# Patient Record
Sex: Female | Born: 1970
Health system: Southern US, Community
[De-identification: ages and names within clinical notes are randomized; demographics above are authoritative.]

## PROBLEM LIST (undated history)

## (undated) DIAGNOSIS — S0990XA Unspecified injury of head, initial encounter: Secondary | ICD-10-CM

## (undated) DIAGNOSIS — I639 Cerebral infarction, unspecified: Secondary | ICD-10-CM

## (undated) DIAGNOSIS — R569 Unspecified convulsions: Secondary | ICD-10-CM

## (undated) HISTORY — PX: OTHER SURGICAL HISTORY: SHX169

---

## 2006-05-06 ENCOUNTER — Emergency Department: Payer: Self-pay | Admitting: Emergency Medicine

## 2006-08-03 ENCOUNTER — Emergency Department: Payer: Self-pay | Admitting: Emergency Medicine

## 2006-11-12 ENCOUNTER — Emergency Department: Payer: Self-pay | Admitting: Emergency Medicine

## 2007-09-06 ENCOUNTER — Emergency Department: Payer: Self-pay | Admitting: Internal Medicine

## 2008-02-19 ENCOUNTER — Emergency Department: Payer: Self-pay | Admitting: Emergency Medicine

## 2009-03-30 ENCOUNTER — Emergency Department: Payer: Self-pay | Admitting: Emergency Medicine

## 2009-05-12 ENCOUNTER — Emergency Department: Payer: Self-pay | Admitting: Emergency Medicine

## 2009-11-08 ENCOUNTER — Emergency Department: Payer: Self-pay | Admitting: Emergency Medicine

## 2010-05-29 ENCOUNTER — Emergency Department: Payer: Self-pay | Admitting: Emergency Medicine

## 2012-03-30 LAB — COMPREHENSIVE METABOLIC PANEL
Alkaline Phosphatase: 69 U/L (ref 50–136)
Calcium, Total: 8.3 mg/dL — ABNORMAL LOW (ref 8.5–10.1)
Co2: 27 mmol/L (ref 21–32)
Creatinine: 1.5 mg/dL — ABNORMAL HIGH (ref 0.60–1.30)
EGFR (African American): 50 — ABNORMAL LOW
EGFR (Non-African Amer.): 43 — ABNORMAL LOW
Glucose: 81 mg/dL (ref 65–99)
SGOT(AST): 14 U/L — ABNORMAL LOW (ref 15–37)
SGPT (ALT): 17 U/L (ref 12–78)
Sodium: 145 mmol/L (ref 136–145)
Total Protein: 7.2 g/dL (ref 6.4–8.2)

## 2012-03-30 LAB — CBC
HCT: 43.1 % (ref 35.0–47.0)
RDW: 14.9 % — ABNORMAL HIGH (ref 11.5–14.5)
WBC: 4.4 10*3/uL (ref 3.6–11.0)

## 2012-03-30 LAB — PROTIME-INR: INR: 0.9

## 2012-03-31 ENCOUNTER — Observation Stay: Payer: Self-pay | Admitting: Student

## 2012-03-31 DIAGNOSIS — I059 Rheumatic mitral valve disease, unspecified: Secondary | ICD-10-CM

## 2012-03-31 LAB — CK TOTAL AND CKMB (NOT AT ARMC)
CK-MB: <0.5 ng/mL — ABNORMAL LOW (ref 0.5–3.6)
CK-MB: 0.7 ng/mL (ref 0.5–3.6)

## 2012-03-31 LAB — TROPONIN I
Troponin-I: 0.02 ng/mL
Troponin-I: 0.02 ng/mL

## 2012-03-31 LAB — HEMOGLOBIN A1C: Hemoglobin A1C: 5.2 % (ref 4.2–6.3)

## 2012-04-01 LAB — LIPID PANEL
Cholesterol: 150 mg/dL (ref 0–200)
Ldl Cholesterol, Calc: 98 mg/dL (ref 0–100)
Triglycerides: 72 mg/dL (ref 0–200)

## 2012-04-01 LAB — CBC WITH DIFFERENTIAL/PLATELET
Basophil %: 1 %
Eosinophil #: 0.1 10*3/uL (ref 0.0–0.7)
Eosinophil %: 2.4 %
HCT: 38.9 % (ref 35.0–47.0)
HGB: 12.3 g/dL (ref 12.0–16.0)
MCH: 26.7 pg (ref 26.0–34.0)
MCHC: 31.8 g/dL — ABNORMAL LOW (ref 32.0–36.0)
MCV: 84 fL (ref 80–100)
Monocyte #: 0.3 x10 3/mm (ref 0.2–0.9)
Neutrophil #: 2 10*3/uL (ref 1.4–6.5)
Neutrophil %: 45.4 %
RBC: 4.62 10*6/uL (ref 3.80–5.20)

## 2012-04-01 LAB — COMPREHENSIVE METABOLIC PANEL
Albumin: 3.3 g/dL — ABNORMAL LOW (ref 3.4–5.0)
Anion Gap: 10 (ref 7–16)
Calcium, Total: 8.3 mg/dL — ABNORMAL LOW (ref 8.5–10.1)
Chloride: 109 mmol/L — ABNORMAL HIGH (ref 98–107)
Creatinine: 1.17 mg/dL (ref 0.60–1.30)
EGFR (African American): >60
Glucose: 71 mg/dL (ref 65–99)
Osmolality: 285 (ref 275–301)
Potassium: 3.8 mmol/L (ref 3.5–5.1)
Sodium: 143 mmol/L (ref 136–145)
Total Protein: 6.4 g/dL (ref 6.4–8.2)

## 2012-04-01 LAB — URINALYSIS, COMPLETE
Bacteria: NONE SEEN
Glucose,UR: NEGATIVE mg/dL (ref 0–75)
Ketone: NEGATIVE
Leukocyte Esterase: NEGATIVE
Nitrite: NEGATIVE
Specific Gravity: 1.014 (ref 1.003–1.030)
WBC UR: <1 /HPF (ref 0–5)

## 2012-04-01 LAB — PROTEIN / CREATININE RATIO, URINE
Creatinine, Urine: 134.3 mg/dL — ABNORMAL HIGH (ref 30.0–125.0)
Protein, Random Urine: 18 mg/dL — ABNORMAL HIGH (ref 0–12)
Protein/Creat. Ratio: 134 mg/gCREAT (ref 0–200)

## 2012-05-20 DIAGNOSIS — D6861 Antiphospholipid syndrome: Secondary | ICD-10-CM | POA: Diagnosis present

## 2013-12-23 ENCOUNTER — Emergency Department: Payer: Self-pay | Admitting: Emergency Medicine

## 2013-12-23 LAB — COMPREHENSIVE METABOLIC PANEL
ALBUMIN: 3.2 g/dL — AB (ref 3.4–5.0)
ALT: 10 U/L — AB (ref 12–78)
Alkaline Phosphatase: 64 U/L
Anion Gap: 9 (ref 7–16)
BUN: 18 mg/dL (ref 7–18)
Bilirubin,Total: 0.2 mg/dL (ref 0.2–1.0)
CALCIUM: 8.3 mg/dL — AB (ref 8.5–10.1)
CO2: 23 mmol/L (ref 21–32)
Chloride: 109 mmol/L — ABNORMAL HIGH (ref 98–107)
Creatinine: 1.11 mg/dL (ref 0.60–1.30)
GLUCOSE: 97 mg/dL (ref 65–99)
Osmolality: 283 (ref 275–301)
Potassium: 3.5 mmol/L (ref 3.5–5.1)
SGOT(AST): 15 U/L (ref 15–37)
SODIUM: 141 mmol/L (ref 136–145)
TOTAL PROTEIN: 6.2 g/dL — AB (ref 6.4–8.2)

## 2013-12-23 LAB — DRUG SCREEN, URINE
Amphetamines, Ur Screen: NEGATIVE (ref ?–1000)
BARBITURATES, UR SCREEN: NEGATIVE (ref ?–200)
Benzodiazepine, Ur Scrn: NEGATIVE (ref ?–200)
COCAINE METABOLITE, UR ~~LOC~~: NEGATIVE (ref ?–300)
Cannabinoid 50 Ng, Ur ~~LOC~~: POSITIVE (ref ?–50)
MDMA (Ecstasy)Ur Screen: NEGATIVE (ref ?–500)
Methadone, Ur Screen: NEGATIVE (ref ?–300)
Opiate, Ur Screen: NEGATIVE (ref ?–300)
Phencyclidine (PCP) Ur S: NEGATIVE (ref ?–25)
Tricyclic, Ur Screen: NEGATIVE (ref ?–1000)

## 2013-12-23 LAB — URINALYSIS, COMPLETE
Bilirubin,UR: NEGATIVE
Blood: NEGATIVE
Glucose,UR: NEGATIVE mg/dL (ref 0–75)
NITRITE: NEGATIVE
PH: 5 (ref 4.5–8.0)
Protein: 30
RBC,UR: 2 /HPF (ref 0–5)
Specific Gravity: 1.027 (ref 1.003–1.030)
Squamous Epithelial: 14

## 2013-12-23 LAB — CBC
HCT: 39.9 % (ref 35.0–47.0)
HGB: 13 g/dL (ref 12.0–16.0)
MCH: 27.2 pg (ref 26.0–34.0)
MCHC: 32.5 g/dL (ref 32.0–36.0)
MCV: 84 fL (ref 80–100)
Platelet: 95 10*3/uL — ABNORMAL LOW (ref 150–440)
RBC: 4.78 10*6/uL (ref 3.80–5.20)
RDW: 15 % — ABNORMAL HIGH (ref 11.5–14.5)
WBC: 7.1 10*3/uL (ref 3.6–11.0)

## 2013-12-23 LAB — ETHANOL
Ethanol %: <0.003 % (ref 0.000–0.080)
Ethanol: <3 mg/dL

## 2013-12-23 LAB — TROPONIN I: Troponin-I: <0.02 ng/mL

## 2013-12-28 LAB — CULTURE, BLOOD (SINGLE)

## 2014-02-01 ENCOUNTER — Emergency Department: Payer: Self-pay | Admitting: Emergency Medicine

## 2014-02-02 LAB — TROPONIN I: Troponin-I: <0.02 ng/mL

## 2014-02-02 LAB — COMPREHENSIVE METABOLIC PANEL
ALT: 19 U/L
Albumin: 3.5 g/dL (ref 3.4–5.0)
Alkaline Phosphatase: 62 U/L
Anion Gap: 11 (ref 7–16)
BUN: 22 mg/dL — ABNORMAL HIGH (ref 7–18)
Bilirubin,Total: 0.2 mg/dL (ref 0.2–1.0)
CO2: 21 mmol/L (ref 21–32)
Calcium, Total: 8.3 mg/dL — ABNORMAL LOW (ref 8.5–10.1)
Chloride: 109 mmol/L — ABNORMAL HIGH (ref 98–107)
Creatinine: 1.21 mg/dL (ref 0.60–1.30)
EGFR (African American): >60
EGFR (Non-African Amer.): 55 — ABNORMAL LOW
GLUCOSE: 134 mg/dL — AB (ref 65–99)
Osmolality: 287 (ref 275–301)
Potassium: 3.1 mmol/L — ABNORMAL LOW (ref 3.5–5.1)
SGOT(AST): 25 U/L (ref 15–37)
SODIUM: 141 mmol/L (ref 136–145)
TOTAL PROTEIN: 6.7 g/dL (ref 6.4–8.2)

## 2014-02-02 LAB — URINALYSIS, COMPLETE
Bacteria: NONE SEEN
Bilirubin,UR: NEGATIVE
Blood: NEGATIVE
Glucose,UR: NEGATIVE mg/dL (ref 0–75)
KETONE: NEGATIVE
Leukocyte Esterase: NEGATIVE
Nitrite: NEGATIVE
PH: 5 (ref 4.5–8.0)
Protein: NEGATIVE
RBC,UR: NONE SEEN /HPF (ref 0–5)
Specific Gravity: 1.012 (ref 1.003–1.030)
Squamous Epithelial: 1

## 2014-02-02 LAB — CBC WITH DIFFERENTIAL/PLATELET
BASOS ABS: 0 10*3/uL (ref 0.0–0.1)
BASOS PCT: 0.5 %
Eosinophil #: 0.1 10*3/uL (ref 0.0–0.7)
Eosinophil %: 2.6 %
HCT: 39.2 % (ref 35.0–47.0)
HGB: 12.6 g/dL (ref 12.0–16.0)
LYMPHS PCT: 51.7 %
Lymphocyte #: 2.8 10*3/uL (ref 1.0–3.6)
MCH: 27.2 pg (ref 26.0–34.0)
MCHC: 32.3 g/dL (ref 32.0–36.0)
MCV: 84 fL (ref 80–100)
MONOS PCT: 11.8 %
Monocyte #: 0.6 x10 3/mm (ref 0.2–0.9)
NEUTROS ABS: 1.8 10*3/uL (ref 1.4–6.5)
Neutrophil %: 33.4 %
PLATELETS: 112 10*3/uL — AB (ref 150–440)
RBC: 4.64 10*6/uL (ref 3.80–5.20)
RDW: 16.2 % — ABNORMAL HIGH (ref 11.5–14.5)
WBC: 5.4 10*3/uL (ref 3.6–11.0)

## 2014-02-02 LAB — PROTIME-INR
INR: 1
PROTHROMBIN TIME: 12.9 s (ref 11.5–14.7)

## 2014-02-02 LAB — APTT: ACTIVATED PTT: 47 s — AB (ref 23.6–35.9)

## 2014-02-02 LAB — DRUG SCREEN, URINE
AMPHETAMINES, UR SCREEN: NEGATIVE (ref ?–1000)
BARBITURATES, UR SCREEN: NEGATIVE (ref ?–200)
BENZODIAZEPINE, UR SCRN: NEGATIVE (ref ?–200)
Cannabinoid 50 Ng, Ur ~~LOC~~: POSITIVE (ref ?–50)
Cocaine Metabolite,Ur ~~LOC~~: NEGATIVE (ref ?–300)
MDMA (ECSTASY) UR SCREEN: NEGATIVE (ref ?–500)
METHADONE, UR SCREEN: NEGATIVE (ref ?–300)
OPIATE, UR SCREEN: NEGATIVE (ref ?–300)
PHENCYCLIDINE (PCP) UR S: NEGATIVE (ref ?–25)
Tricyclic, Ur Screen: NEGATIVE (ref ?–1000)

## 2014-10-19 NOTE — Discharge Summary (Signed)
PATIENT NAME:  Dawn PringleBRIGGS, Verlyn V MR#:  [id number] DATE OF BIRTH:  1970/10/04  DATE OF ADMISSION:  03/31/2012 DATE OF DISCHARGE:  04/01/2012  CONSULTANTS:  Dr. Malvin JohnsPotter from neurology  [facility].   CHIEF COMPLAINT: Slurred speech, left-sided weakness and numbness.   DISCHARGE DIAGNOSES:  1. Left-sided weakness and numbness, possibly from seizure, also possible as a transient ischemic attack.  2. Acute renal failure.  3. Seizure disorder/epilepsy.  4. Noncompliant with aspirin.  5. Mitral regurgitation from rheumatoid valvular heart disease.  6. History of multiple transient ischemic attacks in the past.   7. Morbid obesity.  8. Tobacco abuse.   DISCHARGE MEDICATIONS:  1. Simvastatin 40 mg daily.  2. Aspirin 325 mg daily.  3. Buspirone 7.5 mg at bedtime and 15 mg in the morning.  4. Sertraline 100 mg at bedtime.  5. Keppra 1500 mg every 12 hours.   DIET: Low sodium.   ACTIVITY: As tolerated.   DISCHARGE FOLLOWUP:  1. Please follow up with Dr. Malvin JohnsPotter from neurology within 1 to 2 days.  2. Please follow up with [facility] care physician [facility] and scheduling for a transesophageal echocardiogram within 1 to 2 days for evaluation of mitral valve disease.   DISPOSITION: Home.   CODE STATUS: FULL CODE.   HISTORY OF PRESENT ILLNESS: For full details of the history and physical, please see the History and Physical by Dr. Cherylann RatelLateef on 03/31/2012. Briefly, this is a 44 year old female with history of hyperlipidemia, seizure disorder, multiple transient ischemic attacks in the past who came in with acute onset of left-sided numbness and weakness with some slurred speech that lasted about 30 minutes and improved by the time of admission. She [facility] Of note, she had not taken her aspirin, said she has been taking ibuprofen by mistake for about a month or so. She had a CT of the brain which did  not show any acute intracranial process and was admitted to the hospitalist service. She was also noted to have acute renal failure with creatinine of about 1.5 on arrival and chronic thrombocytopenia.   SIGNIFICANT LABS AND IMAGING: Initial creatinine 1.5, by discharge 1.17. Initial chloride 113, LDL 98, HDL 38, hemoglobin A1c 5.2. LFTs: AST 14 and otherwise within normal limits. On arrival troponins were negative times three. Platelets were 78 initially and 81 on 10/01. WBC  4.4, hemoglobin 13.9. Initial INR 0.9. Urine creatinine was elevated at 134, creatinine to protein ratio was 134, random urine protein was 18. Urinalysis not suggestive of infection. CT of the head showing no evidence of mass effect, midline shift, or extra-axial fluid collection. There is an old left occipital lobe and right frontal lobe infarct with encephalomalacia. No acute intracranial process. MRI of the brain without contrast showing chronic ischemic changes. No acute abnormalities. There is encephalomalacia in the frontal parietal region consistent with old infarct. Similar findings noted in the left cerebellar hemisphere. Ultrasound of the kidneys bilaterally showing normal renal ultrasound and ultrasound of the carotids bilaterally showing normal carotid Doppler ultrasound. Echocardiogram done on 09/30 showing ejection fraction of 50 to 55%, borderline concentric left ventricular hypertrophy, elevated left atrial pressures, mitral valve leaflets appeared thickened but opened well, calcified mitral apparatus? with mild rheumatic deformity. Posterior leaflet has limited mobility, mild mitral stenosis. There is moderate to severe mitral regurgitation. EEG done on 10/01 showing epileptiform activity in the right frontotemporal head region and  occasional generalized spike wave complexes. Epileptiform activity in the frontotemporal head region indicates cortical irritability with increased predisposition to focal onset epilepsy from this  location. Occasional generalized spike and wave complexes raise the possibility of  primary generalized epilepsy syndrome.   HOSPITAL COURSE: The patient was admitted to the hospitalist service and underwent extensive work-up. The patient had no further neurological dysfunction. Neurology was consulted. The thought was initially that she had a transient ischemic attack. She has had multiple transient ischemic attacks in the past and the CT scan shows encephalomalacia in several regions. MRI of the head was negative for acute stroke and carotid ultrasound did not show any significant stenosis. She had not taken her aspirin and compliance was thoroughly encouraged. Per neurology it is also possible she has been having seizures and therefore an EEG was ordered, the result of which is dictated above. It is possible she has been having complex partial seizures. The recommendation was to increase Keppra to 1500 mg q.12 hours and follow up with neurology in the near future. Furthermore, an echocardiogram was obtained showing rheumatoid mitral valve disease and a TEE was recommended. This was discussed with the patient and she states she can follow up with [facility] for that. She had no further seizure activity while in the hospital. She does have chronic thrombocytopenia, which is chronic and stable. At this point, she will be discharged with outpatient followup. She did have mild renal failure on arrival with creatinine of 1.5, which did improve by discharge. She had no significant hydronephrosis but does have elevated protein in the urine. It is possible it is from NSAIDs. She was discouraged from taking further ibuprofen. She should follow up and obtain a basic metabolic panel upon discharge with her primary care physician.   CODE STATUS: The patient is a FULL CODE.   TOTAL TIME SPENT: 35 minutes.   ____________________________ Krystal Eaton, MD sa:bjt D: 04/02/2012 14:02:35 ET T: 04/02/2012 16:54:04  ET JOB#: [id number]  cc: Krystal Eaton, MD, <Dictator> Krystal Eaton MD ELECTRONICALLY SIGNED 04/04/2012 15:11

## 2014-10-19 NOTE — H&P (Signed)
PATIENT NAME:  Dawn Stein, FELD MR#:  [id number] DATE OF BIRTH:  28-Mar-1971  DATE OF ADMISSION:  03/31/2012  REFERRING PHYSICIAN: Dr. Dahlia Client    CHIEF COMPLAINT: Slurred speech, left-sided weakness and numbness.   HISTORY OF PRESENT ILLNESS: The patient is a 44 year old African American female with past medical history of seizure disorder, hyperlipidemia, multiple TIAs, approximately 8 per the patient's report, who presented to [facility] with acute onset of left-sided numbness and weakness along with slurring of speech. The patient reports that these symptoms occurred at approximately 4 p.m. The patient was at church earlier in the day and then went home. She was at home with her family at which point in time they had a large meal. The patient reports that she began to wash the dishes and developed left-sided weakness and numbness. She reports that she had dropped a dish. The family members also noted some slurring of speech. These symptoms apparently lasted 30 minutes and significantly improved. The patient reports to me that she's had a number of TIAs numbering approximately 8 per her report. She is followed by a neurologist at [facility] named Dr. Purvis Kilts. The patient was supposed to have been taking aspirin 325 mg p.o. daily, however, the patient reports that she has not been taking this for approximately one month. Instead, she was taking ibuprofen. She is not on any other antiplatelet therapy at this time. She had a CT scan of the brain which did not demonstrate any acute intracranial process. The patient did have some lab abnormalities, however. The patient's creatinine is elevated at 1.5. Her prior baseline was 1.19 on 11/08/2009. She also appears to have thrombocytopenia of undetermined etiology. Platelet count is currently 78,000.   PAST MEDICAL HISTORY:  1. Seizure disorder.  2. Hyperlipidemia.  3. History of recurrent TIAs, approximately 8 total according to  patient report.  4. Morbid obesity, BMI 40.3.  5. Tobacco abuse.   PAST SURGICAL HISTORY: None.   ALLERGIES: Tetracycline.   HOME MEDICATIONS:  1. Simvastatin 40 mg p.o. daily.  2. Aspirin 325 mg daily.  3. Keppra 500 mg in the a.m. and 1000 mg in the p.m.  4. Buspirone 15 mg p.o. t.i.d.   SOCIAL HISTORY: The patient lives in [place]. She is separated. She has one son. She is currently on disability. She smokes tobacco approximately 1 pack per day. She drinks alcohol occasionally. She also uses marijuana occasionally.   FAMILY HISTORY: Significant for hypertension and diabetes mellitus.   REVIEW OF SYSTEMS: CONSTITUTIONAL: Currently denies fevers, chills, or weight loss. EYES: Denies double vision or blurry vision. HEENT: Denies headaches, hearing loss. Denies epistaxis or sore throat. CARDIOVASCULAR: Denies chest pain, palpitations, PND, orthopnea. RESPIRATORY: Denies cough, shortness of breath, or hemoptysis. GI: Denies nausea, vomiting, diarrhea, or bloody stools. GU: Denies frequency, urgency, or dysuria. MUSCULOSKELETAL: Denies joint pain, swelling, or redness. INTEGUMENTARY: Denies skin rashes or lesions. NEUROLOGIC: Previously had slurred speech as well as left-sided weakness and numbness but these symptoms have now resolved. PSYCHIATRIC: No reported depression. ENDOCRINE: Denies polyuria, polydipsia, or polyphagia. HEMATOLOGIC/LYMPHATIC: Denies easy bruisability, bleeding, or swollen lymph nodes. ALLERGY/IMMUNOLOGIC: Denies seasonal allergies or history of immunodeficiency.   PHYSICAL EXAMINATION:   VITAL SIGNS: Temperature 98.5, pulse 68, respirations 22, blood pressure 143/77.   GENERAL: Well developed, well nourished, obese African American female currently in no acute distress.   HEENT: Normocephalic, atraumatic. Extraocular movements are intact. Pupils equal, round, and reactive to light. No scleral icterus. Conjunctivae are pink.  No epistaxis noted. Gross hearing intact. Oral  mucosa moist.   NECK: Supple without JVD or lymphadenopathy. No carotid bruits auscultated.   LUNGS: Clear to auscultation bilaterally with normal respiratory effort.   CARDIOVASCULAR: S1, S2 regular rate and rhythm. No obvious murmurs or rubs appreciated.   ABDOMEN: Obese, soft, nontender, nondistended. Bowel sounds positive. No rebound or guarding. No gross organomegaly appreciated.   EXTREMITIES: No clubbing, cyanosis, or edema.   NEUROLOGIC: The patient is alert and oriented to time, person, and place. Strength is 5 out of 5 in both upper and lower extremities. Speech is now normal. Sensation is intact grossly in the upper and lower extremities. Cranial nerves II through XII appear to be within normal limits and without gross deficit at this time.   MUSCULOSKELETAL: No joint redness, swelling, or tenderness appreciated.   SKIN: Warm and dry. No rashes noted.   PSYCHIATRIC: The patient is with appropriate affect, appears to have good insight into her current illness.   LABORATORY DATA: CBC shows WBC 4.4, hemoglobin 13.9, hematocrit 43.1, platelets low at 78. CMP shows sodium 145, potassium 4, chloride 113, CO2 27, BUN 18, creatinine 1.5, glucose 81, alkaline phosphatase 89, ALT 17, AST 14, total protein 7.2, eGFR 50. INR 0.9.  CT head without contrast was negative for acute intracranial process.  EKG shows normal sinus rhythm.   IMPRESSION: This is a 44 year old African American female with past medical history of seizure disorder, hyperlipidemia, reported TIAs x8 in the past, morbid obesity, tobacco abuse, and history of THC use who presented to [facility] with acute onset of left-sided weakness, numbness, and slurred speech which has now resolved.  1. Transient ischemic attack. The patient's symptoms appear to be compatible with another TIA. She has had recurrent TIAs in the past numbering 8 per her report. Her symptoms now appear to have resolved. The patient  was to be taking aspirin 325 mg daily at home, however, over the past month has been taking ibuprofen instead of aspirin. The patient is unclear as to why she made this error. We will resume the patient on aspirin 325 mg daily for now. Will also obtain MRI of the brain to exclude an acute stroke. Will also obtain 2-D echocardiogram and carotid duplex as the patient reports that she has not had any of these studies recently. She is followed by Dr. Purvis Kilts of Neurology at [facility]. Given the recurrent nature of these episodes, we will obtain Neurology consultation with Dr. Manuella Ghazi as well.  2. Acute renal failure. The patient's creatinine is elevated at 1.5. On 11/08/2009 creatinine was 1.19. However, it is possible that she may have underlying chronic kidney disease. Will obtain urinalysis as well as urine protein to creatinine ratio. We will start the patient on IV fluids with half normal saline to see if we can treat any acute component of her renal failure now.  3. Seizure disorder. We will resume Keppra at home doses of 500 mg in the a.m. and 1000 mg in the p.m.  4. Thrombocytopenia. This also appears to be chronic in nature. The patient had low platelets on 11/08/2009 at 110,000. Platelets are lower now at 78,000. The patient may end up requiring outpatient Hematology evaluation for this. She is followed at [facility] for primary care. They may have performed this work-up while the patient was there previously.  5. DVT prophylaxis. Will start the patient on heparin 5000 units sub-Q q.12 hours.  6. Hyperlipidemia. We will  resume the patient on simvastatin 40 mg p.o. daily and check fasting lipid profile.   CODE STATUS: FULL CODE.   TIME SPENT: One hour.   ____________________________ Tama High, MD mnl:drc D: 03/31/2012 04:45:25 ET T: 03/31/2012 06:49:46 ET JOB#: [id number]  cc: Tama High, MD, <Dictator> Mariah Milling Merelyn Klump MD ELECTRONICALLY SIGNED 04/08/2012 23:30

## 2014-10-19 NOTE — Consult Note (Signed)
Referring Physician:  Tama High   Primary Care Physician:  Sheldon Silvan, [address], [place], [place], [place] [address], [phone]  Reason for Consult:  Admit Date: 31-Mar-2012   Reason for Consult: CVA   History of Present Illness:  History of Present Illness:   PATIENT NAME:  Dawn Stein, Dawn Stein [id number] OF BIRTH:  09-08-1970 OF ADMISSION:  03/31/2012 COMPLAINT: Slurred speech, left-sided weakness and numbness.  OF PRESENT ILLNESS:  African American woman with a history of hyperlipidemia, multiple TIAs, and seizure disorder who presented to [facility] with acute onset of left-sided numbness and weakness and slurred speech. She reports that these symptoms occurred at approximately 4 p.m yesterday.  She states that after eating a large meal she was washing the dishes when she noticed that she was beginning to have left-sided weakness and numbness.  This resulted in her dropping a dish that she was washing.  She also noticed mildly slurred speech and says that it "felt like my tongue was too big".  Her symptoms lasted less than an hour and then improved.  She is back to normal now she says.  She had a head CT performed that do not demonstrate any acute abnormalities.  Her creatinine was seen to be elevated at 1.5.  She reportedly has had a number of TIAs in the past with no residual deficits.  She has not been taking aspirin for stroke prevention.  She says that her current symptoms not resemble her normal seizures which per her have only occurred at night in the past.   MEDICAL HISTORY:  1. Seizure disorder.  2. Hyperlipidemia.  History of recurrent TIAs, approximately 8 total according to patient report.  Morbid obesity, BMI 40.3.  Tobacco abuse.  SURGICAL HISTORY: None.   ALLERGIES: Tetracycline.  MEDICATIONS:   1. Simvastatin 40 mg p.o. daily.  2. Aspirin 325 mg daily.  Keppra 500 mg in the a.m. and 1000 mg in the p.m.  Buspirone 15  mg p.o. t.i.d.  HISTORY: The patient lives in [place]. She is separated. She has one son. She is currently on disability. She smokes tobacco approximately 1 pack per day. She drinks alcohol occasionally. She also uses marijuana occasionally.  HISTORY: Significant for hypertension and diabetes mellitus.  well developed woman.  Normocephalic and atraumatic. exam without pharmacologic dilation shows normal disc size, appearance and C/D ratio.  There is no papilledema. and S2 sounds are within normal limits, without murmurs, gallops, or rubs. - Obese- NormalDrift - Absent bilaterally.- Patient declined because she is eating dinner.   Shoulder abduction (deltoid/supraspinatus, axillary/suprascapular n, C5)   Elbow flexion (biceps brachii, musculoskeletal n, C5-6)   Elbow extension (triceps, radial n, C7)   Finger adduction (interossei, ulnar n, T1)    Hip flexion (iliopsoas, L1/L2)   Knee flexion (hamstrings, sciatic n, L5/S1)    Knee extension (quadriceps, femoral n, L3/4)   Ankle dorsiflexion (tibialis anterior, deep fibular n, L4/5)   Ankle plantarflexion (gastroc, tibial n, S1)  STATUS.is oriented to person, place and time.  Recent and remote memory are intact.  Attention span and concentration are intact.  Naming, repetition, comprehension and expressive speech are within normal limits.  Patient's fund of knowledge is within normal limits for educational level. NERVES.   CN II (normal visual acuity and visual fields)   CN III, IV, VI (extraocular muscles are intact)   CN V (facial sensation is intact bilaterally)   CN VII (facial strength is  intact bilaterally)   CN VIII (hearing is intact bilaterally)   CN IX/X (palate elevates midline, normal phonation)   CN XI (shoulder shrug strength is normal and symmetric)   CN XII (tongue protrudes midline) to pain and temp bilaterally (spinothalamic tracts)to position and vibration bilaterally (dorsal columns)    Biceps   Brachioradialis    Patellar   Achillessign  is absent bilaterally. to nose testing is within normal limits bilaterally. head without contrast was negative for acute intracranial process. shows normal sinus rhythm.   African American woman with a history of hyperlipidemia, multiple TIAs, and seizure disorder who presented to [facility] with acute onset of left-sided numbness and weakness and slurred speech.   POSSIBLE RIGHT MCA DISTRIBUTION TIAContinue frequent neuro checks (Q4h) for first 24 hours and reassess frequency after that time.Please call neuro and neurosurgery for any changes in neuro exam from above exam with particular attention to pupillary anisocoria.Brain MRI/A to evaluate for other structural lesions or dissections.Carotid doppler to rule out carotid stenosisTTE with bubble study to evaluate for intracardiac shunts or cardiac thrombiMonitor on telemetry and repeat EKG to r/o afibCheck fasting lipid panel, TSH and HbA1cCycle cardiac enzymesCONCERNGiven that she has had 8-9 events like this lifetime, I would recommend a routine EEG to see if she may be suffering from an occasional simple or complex partial seizure that mimics a stroke.  It is very unusual to continue to have frequent TIAs without a persistent neuro deficit or finding on neuroimaging. Cardiac monitoring as described above.Antiplatelet therapy: aspirin 325 mg PO dailyTREATMENT:Per ATP III guidlines, given that patient has other stroke risk factors such as diabetes, elevated CRP, carotid disease, treat if LDL > 70PRESSURE CONTROL:During the first several hours after stroke, control for factors such as anxiety/stress/pain. When these factors are controlled, if blood pressure remains higher than 220/110, treat with a short acting agent (IV labetalol).Allow permissive HTN for BP less than 220/110The 2 trials of antihypertensive therapy to prevent recurrent stroke that have shown benefit started medication >2 weeks and >4 weeks after the acute stroke.PT, OT, Speech  evaluationsConsult speech therapy for swallowing assessment and stroke education.Strict NPO until cleared by speech therapy.Fall PrecautionsCounseling for smoke cessation as needed.DVT prophylaxis with [place] lovenox. Melrose Nakayama, MD   ROS:   General denies complaints    HEENT no complaints    Lungs no complaints    Cardiac no complaints    GI no complaints    GU no complaints    Musculoskeletal no complaints    Extremities no complaints    Skin no complaints    Neuro no complaints    Endocrine no complaints   Past Medical/Surgical Hx:  CVA/Stroke:   Depression:   Epilepsy:   Denies:   Home Medications: Medication Instructions Last Modified Date/Time  aspirin 325 mg oral enteric coated tablet   once a day  30-Sep-13 06:38  simvastatin 40 mg oral tablet   once a day (at bedtime)  30-Sep-13 06:38  busPIRone 7.5 mg oral tablet tab(s) orally once a day (at bedtime) 30-Sep-13 06:38  busPIRone 15 mg tablet 1 tab(s) orally once a day 30-Sep-13 06:38  levetiracetam 500 mg oral tablet 2.5 tab(s) orally 2 times a day 30-Sep-13 06:38  sertraline 100 mg oral tablet 1 tab(s) orally once a day (at bedtime) 30-Sep-13 06:38   [facility] Current Meds:  Aspirin Enteric Coated tablet, ( Ecotrin)  325 mg Oral daily  - Indication: Pain/Fever/Thromboembolic Disorders/Post MI/Prophylaxis MI  Instructions:  Initiate Bleeding Precautions Protocol--DO NOT  CRUSH  Simvastatin tablet, ( Zocor)  40 mg Oral at bedtime  - Indication: Hypercholesterolemia  levETIRAcetam  tablet,  ( Keppra)  500 mg Oral <User Schedule> ( every 1 day: 10:00 )  - Indication: Seizures, bipolar disorder  levETIRAcetam  tablet,  ( Keppra)  1000 mg Oral at bedtime  - Indication: Seizures, bipolar disorder  HePARin injection, 5000 unit(s), Subcutaneous, q12h  Indication: Anticoagulant, Monitor Anticoags per hospital protocol  Allergies:  Tetracycline: Swelling  Vital Signs: **Vital Signs.:   30-Sep-13 11:45   Vital  Signs Type Routine   Temperature Temperature (F) 97.5   Celsius 36.3   Pulse Pulse 59   Respirations Respirations 16   Systolic BP Systolic BP 85   Diastolic BP (mmHg) Diastolic BP (mmHg) 58   Mean BP 67   Pulse Ox % Pulse Ox % 100   Pulse Ox Activity Level  At rest   Oxygen Delivery Room Air/ 21 %   Lab Results:  Hepatic:  29-Sep-13 20:40    Bilirubin, Total 0.2   Alkaline Phosphatase 69   SGPT (ALT) 17   SGOT (AST)  14   Total Protein, Serum 7.2   Albumin, Serum 3.6  Routine Chem:  29-Sep-13 20:40    Glucose, Serum 81   BUN 18   Creatinine (comp)  1.50   Sodium, Serum 145   Potassium, Serum 4.0   Chloride, Serum  113   CO2, Serum 27   Calcium (Total), Serum  8.3   Osmolality (calc) 290   eGFR (African American)  50   eGFR (Non-African American)  43 (eGFR values <48m/min/1.73 m2 may be an indication of chronic kidney disease (CKD). Calculated eGFR is useful in patients with stable renal function. The eGFR calculation will not be reliable in acutely ill patients when serum creatinine is changing rapidly. It is not useful in  patients on dialysis. The eGFR calculation may not be applicable to patients at the low and high extremes of body sizes, pregnant women, and vegetarians.)   Anion Gap  5  Cardiac:  30-Sep-13 05:36    Troponin I 0.03 (0.00-0.05 0.05 ng/mL or less: NEGATIVE  Repeat testing in 3-6 hrs  if clinically indicated. >0.05 ng/mL: POTENTIAL  MYOCARDIAL INJURY. Repeat  testing in 3-6 hrs if  clinically indicated. NOTE: An increase or decrease  of 30% or more on serial  testing suggests a  clinically important change)   CK, Total 113   CPK-MB, Serum  < 0.5 (Result(s) reported on 31 Mar 2012 at 06:15AM.)  Routine Coag:  29-Sep-13 20:40    Prothrombin 12.2   INR 0.9 (INR reference interval applies to patients on anticoagulant therapy. A single INR therapeutic range for coumarins is not optimal for all indications; however, the suggested range for  most indications is 2.0 - 3.0. Exceptions to the INR Reference Range may include: Prosthetic heart valves, acute myocardial infarction, prevention of myocardial infarction, and combinations of aspirin and anticoagulant. The need for a higher or lower target INR must be assessed individually. Reference: The Pharmacology and Management of the Vitamin K  antagonists: the seventh ACCP Conference on Antithrombotic and Thrombolytic Therapy. CDIYME.1583Sept:126 (3suppl): [id number] A HCT value >55% may artifactually increase the PT.  In one study,  the increase was an average of 25%. Reference:  "Effect on Routine and Special Coagulation Testing Values of Citrate Anticoagulant Adjustment in Patients with High HCT Values." American Journal of Clinical Pathology 2006;126:400-405.)  Routine Hem:  29-Sep-13 20:40  WBC (CBC) 4.4   RBC (CBC) 5.09   Hemoglobin (CBC) 13.9   Hematocrit (CBC) 43.1   Platelet Count (CBC)  78 (Result(s) reported on 30 Mar 2012 at 08:08PM.)   MCV 85   MCH 27.4   MCHC 32.4   RDW  14.9   Radiology Results: [place]:    30-Sep-13 10:25, US Carotid Doppler Bilateral   US Carotid Doppler Bilateral    REASON FOR EXAM:    TIA  COMMENTS:       PROCEDURE: [place]  - US CAROTID DOPPLER BILATERAL  - Mar 31 2012 10:25AM     RESULT: Duplex carotid sonography was performed with spectral analysis.    Neither the right nor left carotid systems demonstrate evidence of   calcified or soft plaque. The waveform patterns and the color flow images   are normal. On the right the peak internal carotid systolic velocity   measured 89 cm per second and the peak common carotid velocity measured   78 cm per second corresponding to normal ratio of 1.1. On the left the   peak internal carotid systolic velocity measured 71 cm per second and the   peak common carotid velocity measured 64 cm per second corresponding to a   normal ratio of 1.1. The vertebral arteries are normal in flow direction      bilaterally.    IMPRESSION:  Normal carotid Doppler ultrasound examination.     Dictation Site: 2          Verified By: DAVID A. Martinique, M.D., MD  MRI:    30-Sep-13 10:58, MRI Brain Without Contrast   MRI Brain Without Contrast    REASON FOR EXAM:    TIA  COMMENTS:       PROCEDURE: MR  - MR BRAIN WO CONTRAST  - Mar 31 2012 10:58AM     RESULT: History: TIA.    Comparison Study: Prior head CT of 03/30/2012.    Findings: Multiplanar, multisequence images brain is obtained.   Diffusion-weighted images are normal. Deep white matter and subcortical   white matter changes noted consistent with chronic ischemia.   Encephalomalacia noted in the right frontoparietal region consistent with   old infarct. Similar finding noted in the left cerebellar hemisphere.   Orbits and paranasal sinuses are normal. Posterior fossa is otherwise     normal. Vascular flow voids are normal. There are no mass lesions. No   hydrocephalus.    IMPRESSION:  Chronic ischemic change. No acute abnormality.          Verified By: Osa Craver, M.D., MD  CT:    29-Sep-13 19:53, CT Head Without Contrast   CT Head Without Contrast    REASON FOR EXAM:    slurred speech, unilateral numbness, h/o cva  COMMENTS:       PROCEDURE: CT  - CT HEAD WITHOUT CONTRAST  - Mar 30 2012  7:53PM     RESULT: Comparison:  None    Technique: Multiple axial images from the foramen magnum to the vertex  were obtained without IV contrast.    Findings:      There is no evidence of mass effect, midline shift, or extra-axial fluid   collections.  There is no evidence of a space-occupying lesion or   intracranial hemorrhage. There is no evidence of a cortical-based area of     acute infarction. There is an old left occipital lobe and right frontal   lobe infarct with encephalomalacia.  The ventricles and sulci are appropriate for the patient's age. The basal   cisterns are patent.    Visualized portions of  the orbits are unremarkable. The visualized   portions of the paranasal sinuses and mastoid air cells are unremarkable.     The osseous structures are unremarkable.    IMPRESSION:      No acute intracranial process.    Dictation Site: 1          Verified By: Jennette Banker, M.D., MD   Electronic Signatures: Anabel Bene (MD)  (Signed 30-Sep-13 18:24)  Authored: REFERRING PHYSICIAN, Primary Care Physician, Consult, History of Present Illness, Review of Systems, PAST MEDICAL/SURGICAL HISTORY, HOME MEDICATIONS, Current Medications, ALLERGIES, NURSING VITAL SIGNS, LAB RESULTS, RADIOLOGY RESULTS   Last Updated: 30-Sep-13 18:24 by Anabel Bene (MD)

## 2015-01-21 ENCOUNTER — Other Ambulatory Visit: Payer: Self-pay | Admitting: Family Medicine

## 2015-01-21 DIAGNOSIS — Z1231 Encounter for screening mammogram for malignant neoplasm of breast: Secondary | ICD-10-CM

## 2015-01-27 ENCOUNTER — Ambulatory Visit
Admission: RE | Admit: 2015-01-27 | Discharge: 2015-01-27 | Disposition: A | Discharge status: Home / Self Care | Payer: Medicare Other | Source: Ambulatory Visit | Attending: Family Medicine | Admitting: Family Medicine

## 2015-01-27 ENCOUNTER — Ambulatory Visit: Payer: Medicare Other

## 2015-01-27 DIAGNOSIS — Z1231 Encounter for screening mammogram for malignant neoplasm of breast: Secondary | ICD-10-CM | POA: Insufficient documentation

## 2015-01-27 HISTORY — DX: Unspecified convulsions: R56.9

## 2015-04-08 ENCOUNTER — Emergency Department: Payer: Medicare Other

## 2015-04-08 ENCOUNTER — Encounter: Payer: Self-pay | Admitting: Emergency Medicine

## 2015-04-08 ENCOUNTER — Observation Stay
Admission: EM | Admit: 2015-04-08 | Discharge: 2015-04-09 | Disposition: A | Discharge status: Home / Self Care | Payer: Medicare Other | Attending: Specialist | Admitting: Specialist

## 2015-04-08 ENCOUNTER — Observation Stay: Payer: Medicare Other

## 2015-04-08 DIAGNOSIS — Z801 Family history of malignant neoplasm of trachea, bronchus and lung: Secondary | ICD-10-CM | POA: Diagnosis not present

## 2015-04-08 DIAGNOSIS — Z79899 Other long term (current) drug therapy: Secondary | ICD-10-CM | POA: Diagnosis not present

## 2015-04-08 DIAGNOSIS — R42 Dizziness and giddiness: Secondary | ICD-10-CM | POA: Diagnosis not present

## 2015-04-08 DIAGNOSIS — Z881 Allergy status to other antibiotic agents status: Secondary | ICD-10-CM | POA: Insufficient documentation

## 2015-04-08 DIAGNOSIS — Z87892 Personal history of anaphylaxis: Secondary | ICD-10-CM | POA: Insufficient documentation

## 2015-04-08 DIAGNOSIS — F329 Major depressive disorder, single episode, unspecified: Secondary | ICD-10-CM | POA: Insufficient documentation

## 2015-04-08 DIAGNOSIS — F1210 Cannabis abuse, uncomplicated: Secondary | ICD-10-CM | POA: Insufficient documentation

## 2015-04-08 DIAGNOSIS — R531 Weakness: Secondary | ICD-10-CM | POA: Insufficient documentation

## 2015-04-08 DIAGNOSIS — F17210 Nicotine dependence, cigarettes, uncomplicated: Secondary | ICD-10-CM | POA: Diagnosis not present

## 2015-04-08 DIAGNOSIS — R471 Dysarthria and anarthria: Secondary | ICD-10-CM

## 2015-04-08 DIAGNOSIS — Z7982 Long term (current) use of aspirin: Secondary | ICD-10-CM | POA: Insufficient documentation

## 2015-04-08 DIAGNOSIS — R4781 Slurred speech: Principal | ICD-10-CM | POA: Insufficient documentation

## 2015-04-08 DIAGNOSIS — Z8673 Personal history of transient ischemic attack (TIA), and cerebral infarction without residual deficits: Secondary | ICD-10-CM | POA: Diagnosis not present

## 2015-04-08 DIAGNOSIS — G40909 Epilepsy, unspecified, not intractable, without status epilepticus: Secondary | ICD-10-CM | POA: Insufficient documentation

## 2015-04-08 DIAGNOSIS — I639 Cerebral infarction, unspecified: Secondary | ICD-10-CM | POA: Diagnosis present

## 2015-04-08 DIAGNOSIS — Z23 Encounter for immunization: Secondary | ICD-10-CM | POA: Diagnosis not present

## 2015-04-08 DIAGNOSIS — R4701 Aphasia: Secondary | ICD-10-CM | POA: Diagnosis not present

## 2015-04-08 HISTORY — DX: Cerebral infarction, unspecified: I63.9

## 2015-04-08 LAB — COMPREHENSIVE METABOLIC PANEL
ALK PHOS: 63 U/L (ref 38–126)
ALT: 9 U/L — ABNORMAL LOW (ref 14–54)
ANION GAP: 6 (ref 5–15)
AST: 14 U/L — ABNORMAL LOW (ref 15–41)
Albumin: 3.6 g/dL (ref 3.5–5.0)
BILIRUBIN TOTAL: 0.4 mg/dL (ref 0.3–1.2)
BUN: 15 mg/dL (ref 6–20)
CALCIUM: 8.1 mg/dL — AB (ref 8.9–10.3)
CO2: 23 mmol/L (ref 22–32)
Chloride: 112 mmol/L — ABNORMAL HIGH (ref 101–111)
Creatinine, Ser: 1.36 mg/dL — ABNORMAL HIGH (ref 0.44–1.00)
GFR calc non Af Amer: 47 mL/min — ABNORMAL LOW (ref >60)
GFR, EST AFRICAN AMERICAN: 54 mL/min — AB (ref >60)
Glucose, Bld: 70 mg/dL (ref 65–99)
Potassium: 4.1 mmol/L (ref 3.5–5.1)
Sodium: 141 mmol/L (ref 135–145)
TOTAL PROTEIN: 6.6 g/dL (ref 6.5–8.1)

## 2015-04-08 LAB — DIFFERENTIAL
Basophils Absolute: 0 10*3/uL (ref 0–0.1)
Basophils Relative: 1 %
Eosinophils Absolute: 0.1 10*3/uL (ref 0–0.7)
LYMPHS ABS: 1.3 10*3/uL (ref 1.0–3.6)
MONO ABS: 0.3 10*3/uL (ref 0.2–0.9)
Neutro Abs: 2 10*3/uL (ref 1.4–6.5)
Neutrophils Relative %: 54 %

## 2015-04-08 LAB — APTT: aPTT: 47 seconds — ABNORMAL HIGH (ref 24–36)

## 2015-04-08 LAB — CBC
HEMATOCRIT: 41.2 % (ref 35.0–47.0)
HEMOGLOBIN: 13.3 g/dL (ref 12.0–16.0)
MCH: 27.2 pg (ref 26.0–34.0)
MCHC: 32.4 g/dL (ref 32.0–36.0)
MCV: 83.9 fL (ref 80.0–100.0)
Platelets: 79 10*3/uL — ABNORMAL LOW (ref 150–440)
RBC: 4.91 MIL/uL (ref 3.80–5.20)
RDW: 14.9 % — ABNORMAL HIGH (ref 11.5–14.5)
WBC: 3.7 10*3/uL (ref 3.6–11.0)

## 2015-04-08 LAB — TROPONIN I: Troponin I: <0.03 ng/mL (ref <0.031)

## 2015-04-08 LAB — PROTIME-INR
INR: 0.95
PROTHROMBIN TIME: 12.9 s (ref 11.4–15.0)

## 2015-04-08 LAB — GLUCOSE, CAPILLARY: GLUCOSE-CAPILLARY: 72 mg/dL (ref 65–99)

## 2015-04-08 MED ORDER — ENOXAPARIN SODIUM 40 MG/0.4ML ~~LOC~~ SOLN
40.0000 mg | SUBCUTANEOUS | Status: DC
  Administered 2015-04-08: 17:00:00 40 mg via SUBCUTANEOUS
  Filled 2015-04-08 (×2): qty 0.4

## 2015-04-08 MED ORDER — SIMVASTATIN 40 MG PO TABS
40.0000 mg | ORAL_TABLET | Freq: Every day | ORAL | Status: DC
  Administered 2015-04-08: 21:00:00 40 mg via ORAL
  Filled 2015-04-08: qty 1

## 2015-04-08 MED ORDER — LEVETIRACETAM 500 MG PO TABS: 1250.0000 mg | ORAL_TABLET | Freq: Two times a day (BID) | ORAL | Status: DC

## 2015-04-08 MED ORDER — SERTRALINE HCL 50 MG PO TABS
100.0000 mg | ORAL_TABLET | Freq: Every day | ORAL | Status: DC
  Administered 2015-04-09: 09:00:00 100 mg via ORAL
  Filled 2015-04-08: qty 2

## 2015-04-08 MED ORDER — NICOTINE 21 MG/24HR TD PT24
21.0000 mg | MEDICATED_PATCH | Freq: Every day | TRANSDERMAL | Status: DC
  Administered 2015-04-08 – 2015-04-09 (×2): 21 mg via TRANSDERMAL
  Filled 2015-04-08 (×2): qty 1

## 2015-04-08 MED ORDER — ONDANSETRON HCL 4 MG/2ML IJ SOLN: 4.0000 mg | Freq: Four times a day (QID) | INTRAMUSCULAR | Status: DC | PRN

## 2015-04-08 MED ORDER — ASPIRIN 81 MG PO CHEW
324.0000 mg | CHEWABLE_TABLET | Freq: Once | ORAL | Status: AC
  Administered 2015-04-08: 243 mg via ORAL
  Filled 2015-04-08: qty 4

## 2015-04-08 MED ORDER — ASPIRIN EC 81 MG PO TBEC
81.0000 mg | DELAYED_RELEASE_TABLET | Freq: Every day | ORAL | Status: DC
  Administered 2015-04-09: 09:00:00 81 mg via ORAL
  Filled 2015-04-08: qty 1

## 2015-04-08 MED ORDER — ONDANSETRON HCL 4 MG PO TABS: 4.0000 mg | ORAL_TABLET | Freq: Four times a day (QID) | ORAL | Status: DC | PRN

## 2015-04-08 MED ORDER — ACETAMINOPHEN 650 MG RE SUPP: 650.0000 mg | Freq: Four times a day (QID) | RECTAL | Status: DC | PRN

## 2015-04-08 MED ORDER — SENNOSIDES-DOCUSATE SODIUM 8.6-50 MG PO TABS: 1.0000 | ORAL_TABLET | Freq: Every evening | ORAL | Status: DC | PRN

## 2015-04-08 MED ORDER — LEVETIRACETAM 750 MG PO TABS
1500.0000 mg | ORAL_TABLET | Freq: Two times a day (BID) | ORAL | Status: DC
  Administered 2015-04-08 – 2015-04-09 (×2): 1500 mg via ORAL
  Filled 2015-04-08 (×2): qty 2

## 2015-04-08 MED ORDER — SODIUM CHLORIDE 0.9 % IV BOLUS (SEPSIS)
500.0000 mL | Freq: Once | INTRAVENOUS | Status: AC
Start: 2015-04-08 — End: 2015-04-08
  Administered 2015-04-08: 17:00:00 500 mL via INTRAVENOUS

## 2015-04-08 MED ORDER — INFLUENZA VAC SPLIT QUAD 0.5 ML IM SUSY
0.5000 mL | PREFILLED_SYRINGE | INTRAMUSCULAR | Status: AC
  Administered 2015-04-09: 0.5 mL via INTRAMUSCULAR
  Filled 2015-04-08: qty 0.5

## 2015-04-08 MED ORDER — ACETAMINOPHEN 325 MG PO TABS: 650.0000 mg | ORAL_TABLET | Freq: Four times a day (QID) | ORAL | Status: DC | PRN

## 2015-04-08 NOTE — ED Notes (Signed)
Pt to ed with c/o slurred speech acute onset at 0915.  Pt states hx of stroke.  Also reports feeling dizzzy.

## 2015-04-08 NOTE — ED Provider Notes (Signed)
[facility] Emergency Department Provider Note REMINDER - THIS NOTE IS NOT A FINAL MEDICAL RECORD UNTIL IT IS SIGNED. UNTIL THEN, THE CONTENT BELOW MAY REFLECT INFORMATION FROM A DOCUMENTATION TEMPLATE, NOT THE ACTUAL PATIENT VISIT. ____________________________________________  Time seen: Approximately 11:18 AM  I have reviewed the triage vital signs and the nursing notes.   HISTORY  Chief Complaint Aphasia    HPI Dawn Stein is a 44 y.o. female previous history of multiple TIAs and history of seizures.  Patient reports she got up normally today, then suddenly about 9:15 AM she experience a ringing in her ears, feeling off balance" dizzy" and noticed that she was having trouble speaking. Her son and family are with her and reports that these symptoms were very clear, and she is having lots of trouble speaking but is now much better.  The patient reports that she is improving. She does still have a little bit of trouble with her speech but otherwise feels well. She denies any numbness or tingling. She does have some issues with very poor vision in both eyes, but no changes. She currently does not feel off balance or "dizzy" anymore. Overall her symptoms are much better except for some mild difficulty speaking. She denies any numbness or weakness in her arms or legs. No weakness in the face.  No confusion. No pain. No seizure was observed.   Past Medical History  Diagnosis Date  . Seizures (HCC)   . Stroke [facility])     There are no active problems to display for this patient.   History reviewed. No pertinent past surgical history.  Current Outpatient Rx  Name  Route  Sig  Dispense  Refill  . aspirin EC 81 MG tablet   Oral   Take 81 mg by mouth daily.         Marland Kitchen levETIRAcetam (KEPPRA) 500 MG tablet   Oral   Take 1,250 mg by mouth 2 (two) times daily.         . sertraline (ZOLOFT) 100 MG tablet   Oral   Take 100 mg by mouth daily.          . simvastatin (ZOCOR) 40 MG tablet   Oral   Take 40 mg by mouth at bedtime.           Allergies Tetracyclines & related  Family History  Problem Relation Age of Onset  . Lung cancer Maternal Grandfather     Social History Social History  Substance Use Topics  . Smoking status: Current Every Day Smoker  . Smokeless tobacco: None  . Alcohol Use: No    Review of Systems Constitutional: No fever/chills Eyes: No visual changes. ENT: No sore throat. Cardiovascular: Denies chest pain. Respiratory: Denies shortness of breath. Gastrointestinal: No abdominal pain.  No nausea, no vomiting.  No diarrhea.  No constipation. Genitourinary: Negative for dysuria. Musculoskeletal: Negative for back pain. Skin: Negative for rash. Neurological: Negative for headaches, focal weakness or numbness.  10-point ROS otherwise negative.  ____________________________________________   PHYSICAL EXAM:  VITAL SIGNS: ED Triage Vitals  Enc Vitals Group     BP 04/08/15 1021 117/57 mmHg     Pulse Rate 04/08/15 1021 118     Resp 04/08/15 1021 20     Temp 04/08/15 1021 98.1 F (36.7 C)     Temp Source 04/08/15 1021 Oral     SpO2 04/08/15 1021 100 %     Weight 04/08/15 1021 215 lb (97.523 kg)  Height 04/08/15 1021  (1.575 m)     Head Cir --      Peak Flow --      Pain Score 04/08/15 1022 0     Pain Loc --      Pain Edu? --      Excl. in GC? --    Constitutional: Alert and oriented. Well appearing and in no acute distress. Eyes: Conjunctivae are normal. PERRL. EOMI. Head: Atraumatic. Nose: No congestion/rhinnorhea. Mouth/Throat: Mucous membranes are moist.  Oropharynx non-erythematous. Neck: No stridor.   Cardiovascular: Normal rate, regular rhythm. Grossly normal heart sounds.  Good peripheral circulation. Respiratory: Normal respiratory effort.  No retractions. Lungs CTAB. Gastrointestinal: Soft and nontender. No distention. No abdominal bruits. No CVA  tenderness. Musculoskeletal: No lower extremity tenderness nor edema.  No joint effusions. Neurologic:   No gross focal neurologic deficits are appreciated.  Cranial nerves are normal. Normal strength in all extremities. No pronator drift. No visual field deficits aside from chronic vision problems as patient reports. No nystagmus. Extraocular movements are normal. No numbness or tingling. The patient does have some slight word finding difficulty  I performed a complete NIH stroke assessment was equal to 01.   Skin:  Skin is warm, dry and intact. No rash noted. Psychiatric: Mood and affect are normal. Speech and behavior are normal.  ____________________________________________   LABS (all labs ordered are listed, but only abnormal results are displayed)  Labs Reviewed  APTT - Abnormal; Notable for the following:    aPTT 47 (*)    All other components within normal limits  CBC - Abnormal; Notable for the following:    RDW 14.9 (*)    Platelets 79 (*)    All other components within normal limits  COMPREHENSIVE METABOLIC PANEL - Abnormal; Notable for the following:    Chloride 112 (*)    Creatinine, Ser 1.36 (*)    Calcium 8.1 (*)    AST 14 (*)    ALT 9 (*)    GFR calc non Af Amer 47 (*)    GFR calc Af Amer 54 (*)    All other components within normal limits  PROTIME-INR  DIFFERENTIAL  TROPONIN I  GLUCOSE, CAPILLARY  LEVETIRACETAM LEVEL  CBG MONITORING, ED   ____________________________________________  EKG  ED ECG REPORT I, Dany Harten, the attending physician, personally viewed and interpreted this ECG.  Date: 04/08/2015 EKG Time: 1050 Rate: 60 Rhythm: normal sinus rhythm QRS Axis: normal Intervals: normal ST/T Wave abnormalities: normal Conduction Disutrbances: none Narrative Interpretation: unremarkable  ____________________________________________  RADIOLOGY  CT Head Wo Contrast (Final result) Result time: 04/08/15 10:45:23   Final result by Rad  Results In Interface (04/08/15 10:45:23)   Narrative:   CLINICAL DATA: 44 year old female with acute onset of slurred speech beginning at 09:15 today. Symptoms accompanied by dizziness  EXAM: CT HEAD WITHOUT CONTRAST  TECHNIQUE: Contiguous axial images were obtained from the base of the skull through the vertex without intravenous contrast.  COMPARISON: Prior head CT 02/01/2014  FINDINGS: Small region of focal hypoattenuation of the cortex and immediate subcortical region in the posterior aspect of the left frontal lobe (image 24 series 2). This abnormality is visible largely on 1 image and may represent streak artifact. Remote prior infarcts in the right MCA and bilateral PCA territories. No evidence of mass, mass effect, intracranial hemorrhage or hydrocephalus. No focal soft tissue or calvarial abnormality. Visualized portions of the globes and orbits are unremarkable. Normal aeration of the visualized mastoid air  cells and paranasal sinuses.  IMPRESSION: 1. Focal hypoattenuation of the posterior left frontal lobe cortex with blurring of the gray-white junction. This may represent a small region of acute ischemia, however the abnormality is at the in a region susceptible to both volume averaging and streak artifact. This finding could be artifactual. 2. Remote prior infarcts in the right MCA and bilateral PCA territories.  These results were called by telephone at the time of interpretation on 04/08/2015 at 10:44 am to Dr. Sharyn Creamer , who verbally acknowledged these results.    ____________________________________________   PROCEDURES  Procedure(s) performed: None  Critical Care performed: Yes, see critical care note(s)  CRITICAL CARE Performed by: Sharyn Creamer   Total critical care time: 45  Critical care time was exclusive of separately billable procedures and treating other patients.  Critical care was necessary to treat or prevent imminent or  life-threatening deterioration.  Critical care was time spent personally by me on the following activities: development of treatment plan with patient and/or surrogate as well as nursing, discussions with consultants, evaluation of patient's response to treatment, examination of patient, obtaining history from patient or surrogate, ordering and performing treatments and interventions, ordering and review of laboratory studies, ordering and review of radiographic studies, pulse oximetry and re-evaluation of patient's condition.  Time includes examination, discussion with consults didn't, discussion with radiologist, review of records. Patient presents with acute onset of neurologic abnormality, specifically require evaluation for possible acute stroke. ____________________________________________   INITIAL IMPRESSION / ASSESSMENT AND PLAN / ED COURSE  Pertinent labs & imaging results that were available during my care of the patient were reviewed by me and considered in my medical decision making (see chart for details).   ___________   Discussed patient's clinical exam, history with Dr. Katrinka Blazing of neurology who advises that TPA is not indicated. Patient has a history of stroke and her symptoms are also rapidly improving with only minor symptomatology at this time. I agree, the risk of TPA seems to far exceed the very minimal and rapidly resolving deficits the patient is reporting at this time.    Dr. Katrinka Blazing will see the patient and consult with an approximately 30 minutes.  My differential at this time is that the likelihood is the patient has had another small ischemic type of stroke or TIA, other possibility would be of a focal seizure.  D/W with Dr. Katrinka Blazing who advises admission for EEG and MRI for possible stroke versus seizure as working diagnosis at this time.  _________________________________   FINAL CLINICAL IMPRESSION(S) / ED DIAGNOSES  Final diagnoses:  Dysarthria      Sharyn Creamer, MD 04/08/15 1248

## 2015-04-08 NOTE — Progress Notes (Signed)
   04/08/15 1030  Clinical Encounter Type  Visited With Patient and family together  Visit Type Spiritual support (Code Stroke)  Referral From Nurse  Consult/Referral To Chaplain  Spiritual Encounters  Spiritual Needs Emotional  Offered pastoral presence. Son was present with mother. Had been through this before. All seemed calm.  Chaplain Irving Burton

## 2015-04-08 NOTE — H&P (Signed)
[facility] - [place] [facility]   PATIENT NAME: Dawn Stein    MR#:  [id number]  DATE OF BIRTH:  [birth year]  DATE OF ADMISSION:  04/08/2015  PRIMARY CARE PHYSICIAN: [facility]   REQUESTING/REFERRING PHYSICIAN: Dr. Fanny Bien  CHIEF COMPLAINT:   Chief Complaint  Patient presents with  . Aphasia    HISTORY OF PRESENT ILLNESS:  Dawn Stein  is a 44 y.o. female with a known history of seizure disorder, previous stroke comes in with slurred speech. Patient had history of multiple days before. According to the family patient had slurred speech today morning and by the time she came to emergency room speech was clear. Patient complained of weakness in the legs but unable to tell me which side she was feeling weak. No headache, no double vision.  PAST MEDICAL HISTORY:   Past Medical History  Diagnosis Date  . Seizures (HCC)   . Stroke [facility])     PAST SURGICAL HISTOIRY:  History reviewed. No pertinent past surgical history.  SOCIAL HISTORY:   Social History  Substance Use Topics  . Smoking status: Current Every Day Smoker  . Smokeless tobacco: Not on file  . Alcohol Use: No    FAMILY HISTORY:   Family History  Problem Relation Age of Onset  . Lung cancer Maternal Grandfather     DRUG ALLERGIES:   Allergies  Allergen Reactions  . Tetracyclines & Related Anaphylaxis    Facial swelling    REVIEW OF SYSTEMS:  CONSTITUTIONAL: No fever, fatigue or weakness.  EYES: No blurred or double vision.  EARS, NOSE, AND THROAT: No tinnitus or ear pain.  RESPIRATORY: No cough, shortness of breath, wheezing or hemoptysis.  CARDIOVASCULAR: No chest pain, orthopnea, edema.  GASTROINTESTINAL: No nausea, vomiting, diarrhea or abdominal pain.  GENITOURINARY: No dysuria, hematuria.  ENDOCRINE: No polyuria, nocturia,  HEMATOLOGY: No anemia, easy bruising or bleeding SKIN: No rash or lesion. MUSCULOSKELETAL: No joint pain or arthritis.    NEUROLOGIC: Dizziness today, slurred speech today now resolved. Had focal weakness but denies any weakness now. PSYCHIATRY: No anxiety or depression.   MEDICATIONS AT HOME:   Prior to Admission medications   Medication Sig Start Date End Date Taking? Authorizing Provider  aspirin EC 81 MG tablet Take 81 mg by mouth daily.   Yes Historical Provider, MD  levETIRAcetam (KEPPRA) 500 MG tablet Take 1,250 mg by mouth 2 (two) times daily.   Yes Historical Provider, MD  sertraline (ZOLOFT) 100 MG tablet Take 100 mg by mouth daily.   Yes Historical Provider, MD  simvastatin (ZOCOR) 40 MG tablet Take 40 mg by mouth at bedtime.   Yes Historical Provider, MD      VITAL SIGNS:  Blood pressure 133/83, pulse 54, temperature 98.1 F (36.7 C), temperature source Oral, resp. rate 23, height  (1.575 m), weight 97.523 kg (215 lb), last menstrual period 04/08/2015, SpO2 100 %.  PHYSICAL EXAMINATION:  GENERAL:  44 y.o.-year-old patient lying in the bed with no acute distress.  EYES: Pupils equal, round, reactive to light and accommodation. No scleral icterus. Extraocular muscles intact.  HEENT: Head atraumatic, normocephalic. Oropharynx and nasopharynx clear. Does have some hearing loss. NECK:  Supple, no jugular venous distention. No thyroid enlargement, no tenderness.  LUNGS: Normal breath sounds bilaterally, no wheezing, rales,rhonchi or crepitation. No use of accessory muscles of respiration.  CARDIOVASCULAR: S1, S2 normal. No murmurs, rubs, or gallops.  ABDOMEN: Soft, nontender, nondistended. Bowel sounds present. No organomegaly or mass.  EXTREMITIES: No pedal edema, cyanosis, or clubbing.  NEUROLOGIC: Cranial nerves II through XII are intact. Muscle strength 5/5 in all extremities. Sensation intact. Gait not checked.  PSYCHIATRIC: The patient is alert and oriented x 3.  SKIN: No obvious rash, lesion, or ulcer.   LABORATORY PANEL:   CBC  Recent Labs Lab 04/08/15 1047  WBC 3.7  HGB 13.3   HCT 41.2  PLT 79*   ------------------------------------------------------------------------------------------------------------------  Chemistries   Recent Labs Lab 04/08/15 1047  NA 141  K 4.1  CL 112*  CO2 23  GLUCOSE 70  BUN 15  CREATININE 1.36*  CALCIUM 8.1*  AST 14*  ALT 9*  ALKPHOS 63  BILITOT 0.4   ------------------------------------------------------------------------------------------------------------------  Cardiac Enzymes  Recent Labs Lab 04/08/15 1047  TROPONINI <0.03   ------------------------------------------------------------------------------------------------------------------  RADIOLOGY:  Ct Head Wo Contrast  04/08/2015   CLINICAL DATA:  44 year old female with acute onset of slurred speech beginning at 09:15 today. Symptoms accompanied by dizziness  EXAM: CT HEAD WITHOUT CONTRAST  TECHNIQUE: Contiguous axial images were obtained from the base of the skull through the vertex without intravenous contrast.  COMPARISON:  Prior head CT 02/01/2014  FINDINGS: Small region of focal hypoattenuation of the cortex and immediate subcortical region in the posterior aspect of the left frontal lobe (image 24 series 2). This abnormality is visible largely on 1 image and may represent streak artifact. Remote prior infarcts in the right MCA and bilateral PCA territories. No evidence of mass, mass effect, intracranial hemorrhage or hydrocephalus. No focal soft tissue or calvarial abnormality. Visualized portions of the globes and orbits are unremarkable. Normal aeration of the visualized mastoid air cells and paranasal sinuses.  IMPRESSION: 1. Focal hypoattenuation of the posterior left frontal lobe cortex with blurring of the gray-white junction. This may represent a small region of acute ischemia, however the abnormality is at the in a region susceptible to both volume averaging and streak artifact. This finding could be artifactual. 2. Remote prior infarcts in the right  MCA and bilateral PCA territories.  These results were called by telephone at the time of interpretation on 04/08/2015 at 10:44 am to Dr. Sharyn Creamer , who verbally acknowledged these results.   Electronically Signed   By: Malachy Moan M.D.   On: 04/08/2015 10:45    EKG:   Orders placed or performed during the hospital encounter of 04/08/15  . ED EKG  . ED EKG  . EKG 12-Lead  . EKG 12-Lead   sinus bradycardia 57 bpm no ST-T changes.  IMPRESSION AND PLAN:  #69.44 year old the female with recent slurred speech: Likely TIA. Patient has history of seizures unable to exclude a seizure activity today. Patient did have EEG, seen by neurology Dr. Alverda Skeans. Please follow EEG  results, MRI of the brain results, admit to stroke unit, start aspirin, follow neuro checks,. Head CT showed a possible left frontal acute stroke; #2. history of  previous stroke ,seizures and possible lupus anticoagulants: Patient had a workup at [facility] so we will get the records from [facility] to see if she has risk factors for recurrent strokes. #3 tobacco abuse: Patient smokes about 1 pack of cigarettes a day counseled against smoking, offered assistance, will start nicotine patch. #4 marijuana abuse smokes Marijuana;counseleld against illicit drugs; According to family  Pt can not have cafeinated products.\ #5 history of seizure disorder: Patient Keppra has been increased to 1500 mg by mouth twice a day, check EEG, placed under seizure precautions. D/w family:  All the  records are reviewed and case discussed with ED provider. Management plans discussed with the patient, family and they are in agreement.  CODE STATUS: full  TOTAL TIME TAKING CARE OF THIS PATIENT: 55 minutes.    Katha Hamming M.D on 04/08/2015 at 2:44 PM  Between 7am to 6pm - Pager - [phone]  After 6pm go to [email or web address] - password [email or web address]  [facility]  Office  [phone]  CC: Primary care physician; [facility]

## 2015-04-08 NOTE — ED Notes (Signed)
Patient to EEG via stretcher

## 2015-04-08 NOTE — Consult Note (Signed)
Reason for Consult: stroke Referring Physician: Dr. Darlen Round Dawn Stein is an 44 y.o. female.  HPI: seen at request of Dr. Jacqualine Code for stroke;  44 yo RHD F presents to [facility] due to slurred speech, ringing in her ears and dizziness with some problems walking.  This all came on this morning but has gotten much better.  She does not report having this before but does have hx of multiple TIAs in the past.  Pt is not the best historian so it is hard to figure out what these previous TIAs were.  Pt was last seen in [facility] stroke clinic over a year ago.  She is unsure about having any anticoagulation at any time but there is questionable mention of some Lupus anticoagulant.  Pt reports no deficits from previous stroke.  Past Medical History  Diagnosis Date  . Seizures ([place])   . Stroke [facility])     History reviewed. No pertinent past surgical history.  Family History  Problem Relation Age of Onset  . Lung cancer Maternal Grandfather     Social History:  reports that she has been smoking.  She does not have any smokeless tobacco history on file. She reports that she does not drink alcohol or use illicit drugs.  Allergies:  Allergies  Allergen Reactions  . Tetracyclines & Related Anaphylaxis    Facial swelling    Medications: personally reviewed by me as per chart  Results for orders placed or performed during the hospital encounter of 04/08/15 (from the past 48 hour(s))  Glucose, capillary     Status: None   Collection Time: 04/08/15 10:22 AM  Result Value Ref Range   Glucose-Capillary 72 65 - 99 mg/dL  Protime-INR     Status: None   Collection Time: 04/08/15 10:47 AM  Result Value Ref Range   Prothrombin Time 12.9 11.4 - 15.0 seconds   INR 0.95   APTT     Status: Abnormal   Collection Time: 04/08/15 10:47 AM  Result Value Ref Range   aPTT 47 (H) 24 - 36 seconds    Comment:        IF BASELINE aPTT IS ELEVATED, SUGGEST PATIENT RISK ASSESSMENT BE USED TO DETERMINE  APPROPRIATE ANTICOAGULANT THERAPY.   CBC     Status: Abnormal   Collection Time: 04/08/15 10:47 AM  Result Value Ref Range   WBC 3.7 3.6 - 11.0 K/uL   RBC 4.91 3.80 - 5.20 MIL/uL   Hemoglobin 13.3 12.0 - 16.0 g/dL   HCT 41.2 35.0 - 47.0 %   MCV 83.9 80.0 - 100.0 fL   MCH 27.2 26.0 - 34.0 pg   MCHC 32.4 32.0 - 36.0 g/dL   RDW 14.9 (H) 11.5 - 14.5 %   Platelets 79 (L) 150 - 440 K/uL  Differential     Status: None   Collection Time: 04/08/15 10:47 AM  Result Value Ref Range   Neutrophils Relative % 54% %   Neutro Abs 2.0 1.4 - 6.5 K/uL   Lymphocytes Relative 34% %   Lymphs Abs 1.3 1.0 - 3.6 K/uL   Monocytes Relative 8% %   Monocytes Absolute 0.3 0.2 - 0.9 K/uL   Eosinophils Relative 3% %   Eosinophils Absolute 0.1 0 - 0.7 K/uL   Basophils Relative 1% %   Basophils Absolute 0.0 0 - 0.1 K/uL  Comprehensive metabolic panel     Status: Abnormal   Collection Time: 04/08/15 10:47 AM  Result Value Ref Range   Sodium  141 135 - 145 mmol/L   Potassium 4.1 3.5 - 5.1 mmol/L   Chloride 112 (H) 101 - 111 mmol/L   CO2 23 22 - 32 mmol/L   Glucose, Bld 70 65 - 99 mg/dL   BUN 15 6 - 20 mg/dL   Creatinine, Ser 1.36 (H) 0.44 - 1.00 mg/dL   Calcium 8.1 (L) 8.9 - 10.3 mg/dL   Total Protein 6.6 6.5 - 8.1 g/dL   Albumin 3.6 3.5 - 5.0 g/dL   AST 14 (L) 15 - 41 U/L   ALT 9 (L) 14 - 54 U/L   Alkaline Phosphatase 63 38 - 126 U/L   Total Bilirubin 0.4 0.3 - 1.2 mg/dL   GFR calc non Af Amer 47 (L) >60 mL/min   GFR calc Af Amer 54 (L) >60 mL/min    Comment: (NOTE) The eGFR has been calculated using the CKD EPI equation. This calculation has not been validated in all clinical situations. eGFR's persistently <60 mL/min signify possible Chronic Kidney Disease.    Anion gap 6 5 - 15  Troponin I     Status: None   Collection Time: 04/08/15 10:47 AM  Result Value Ref Range   Troponin I <0.03 <0.031 ng/mL    Comment:        NO INDICATION OF MYOCARDIAL INJURY.     Ct Head Wo  Contrast  04/08/2015   CLINICAL DATA:  44 year old female with acute onset of slurred speech beginning at 09:15 today. Symptoms accompanied by dizziness  EXAM: CT HEAD WITHOUT CONTRAST  TECHNIQUE: Contiguous axial images were obtained from the base of the skull through the vertex without intravenous contrast.  COMPARISON:  Prior head CT 02/01/2014  FINDINGS: Small region of focal hypoattenuation of the cortex and immediate subcortical region in the posterior aspect of the left frontal lobe (image 24 series 2). This abnormality is visible largely on 1 image and may represent streak artifact. Remote prior infarcts in the right MCA and bilateral PCA territories. No evidence of mass, mass effect, intracranial hemorrhage or hydrocephalus. No focal soft tissue or calvarial abnormality. Visualized portions of the globes and orbits are unremarkable. Normal aeration of the visualized mastoid air cells and paranasal sinuses.  IMPRESSION: 1. Focal hypoattenuation of the posterior left frontal lobe cortex with blurring of the gray-white junction. This may represent a small region of acute ischemia, however the abnormality is at the in a region susceptible to both volume averaging and streak artifact. This finding could be artifactual. 2. Remote prior infarcts in the right MCA and bilateral PCA territories.  These results were called by telephone at the time of interpretation on 04/08/2015 at 10:44 am to Dr. Delman Kitten , who verbally acknowledged these results.   Electronically Signed   By: Jacqulynn Cadet M.D.   On: 04/08/2015 10:45    Review of Systems  Constitutional: Negative.   HENT: Positive for hearing loss. Negative for congestion, ear discharge, ear pain, nosebleeds, sore throat and tinnitus.   Eyes: Negative.   Respiratory: Negative.  Negative for stridor.   Cardiovascular: Negative.   Gastrointestinal: Negative.   Genitourinary: Negative.   Musculoskeletal: Negative.   Skin: Negative.   Neurological:  Positive for dizziness and focal weakness. Negative for tingling, tremors, sensory change, speech change, seizures, loss of consciousness and headaches.  Psychiatric/Behavioral: Negative.    Blood pressure 133/83, pulse 54, temperature 98.1 F (36.7 C), temperature source Oral, resp. rate 23, height '5\' 2"'  (1.575 m), weight 97.523 kg (215 lb), last  menstrual period 04/08/2015, SpO2 100 %. Physical Exam  Nursing note and vitals reviewed. Constitutional: She appears well-developed and well-nourished. No distress.  HENT:  Head: Normocephalic and atraumatic.  Right Ear: External ear normal.  Left Ear: External ear normal.  Nose: Nose normal.  Mouth/Throat: Oropharynx is clear and moist.  Eyes: Conjunctivae and EOM are normal. Pupils are equal, round, and reactive to light.  Neck: Normal range of motion. Neck supple.  Cardiovascular: Normal rate, regular rhythm, normal heart sounds and intact distal pulses.   Respiratory: Effort normal and breath sounds normal.  GI: Soft. Bowel sounds are normal.  Musculoskeletal: Normal range of motion. She exhibits no edema.  Neurological:  Alert and oriented x 3, nl speech and language although somewhat delayed PERRLA, EOMI, diminished VF B, face symmetric, tongue midline 5-/5 B, nl tone, no tremor FTN and HTS WNL 1+/4 B, mute plantars Intact to pin and temp, no neglect  Skin: Skin is warm. She is not diaphoretic.  Psychiatric: Her speech is delayed. She is slowed and withdrawn.    CT of head personally reviewed by me and shows old B PCA infarcts and an old R frontal infarct  Assessment/Plan: 1.  Possible seizure-  Pt symptoms and slight slowiness of thought go more with seizure than stroke to me;  Due to rapid improvement would not offer tPA at this time 2.  Multiple strokes-  All of these appear embolic yet etiology is unclear.  Curious to know if pt really has positive lupus anticoagulant or some other hypercoaguable disorder -  Obtain chart from  [facility] -  MRI of brain w/o contrast -  EEG -  Increase Keppra to 1538m BID -  Continue asa and statin -  Place on stroke protocol -  Will follow  Dawn Stein 04/08/2015, 2:05 PM

## 2015-04-08 NOTE — ED Notes (Signed)
Neurologist in to see patient. States would like an EEG. He will try to schedule ASAP.  Will admit patient to have MRI today.

## 2015-04-09 LAB — BASIC METABOLIC PANEL
ANION GAP: 5 (ref 5–15)
BUN: 18 mg/dL (ref 6–20)
CALCIUM: 8.3 mg/dL — AB (ref 8.9–10.3)
CO2: 25 mmol/L (ref 22–32)
CREATININE: 1.24 mg/dL — AB (ref 0.44–1.00)
Chloride: 113 mmol/L — ABNORMAL HIGH (ref 101–111)
GFR calc Af Amer: >60 mL/min (ref >60)
GFR, EST NON AFRICAN AMERICAN: 52 mL/min — AB (ref >60)
GLUCOSE: 77 mg/dL (ref 65–99)
Potassium: 3.9 mmol/L (ref 3.5–5.1)
Sodium: 143 mmol/L (ref 135–145)

## 2015-04-09 LAB — CBC
HCT: 37.1 % (ref 35.0–47.0)
HEMOGLOBIN: 11.9 g/dL — AB (ref 12.0–16.0)
MCH: 26.9 pg (ref 26.0–34.0)
MCHC: 32.1 g/dL (ref 32.0–36.0)
MCV: 83.6 fL (ref 80.0–100.0)
Platelets: 81 10*3/uL — ABNORMAL LOW (ref 150–440)
RBC: 4.44 MIL/uL (ref 3.80–5.20)
RDW: 14.5 % (ref 11.5–14.5)
WBC: 3.6 10*3/uL (ref 3.6–11.0)

## 2015-04-09 MED ORDER — LEVETIRACETAM 750 MG PO TABS: 1500.0000 mg | ORAL_TABLET | Freq: Two times a day (BID) | ORAL | Status: DC

## 2015-04-09 NOTE — Plan of Care (Signed)
Problem: Discharge/Transitional Outcomes Goal: Family/Caregiver willing and able to support plan Family/Caregiver willing and able to support plan for self-management after transition home  Outcome: Adequate for Discharge Patient had no c/o pain  VSS  Now scoring O on NIH Received MD order to discharge patient to home Discharge instructions, prescriptions reviewed with patient and patient verbalized understanding discharge with auxillary in wheelchair to home with family member

## 2015-04-09 NOTE — Progress Notes (Signed)
Pt's ride present for discharge; pt discharged via wheelchair by nursing to the visitor's entrance 

## 2015-04-09 NOTE — Plan of Care (Signed)
Problem: Discharge/Transitional Outcomes Goal: Other Discharge Outcomes/Goals Outcome: Progressing Patientexpects to be discharged today   Patient has no c/o pain VSS Patient is scoring a 2 on NIH scale  Patient is tolerating diet well  Patient is a one person stand by assist to the bathroom

## 2015-04-09 NOTE — Discharge Instructions (Signed)
°  DIET:  °Cardiac diet ° °DISCHARGE CONDITION:  °Stable ° °ACTIVITY:  °Activity as tolerated °No Driving ° °OXYGEN:  °Home Oxygen: No. °  °Oxygen Delivery: room air ° °DISCHARGE LOCATION:  °home  ° °If you experience worsening of your admission symptoms, develop shortness of breath, life threatening emergency, suicidal or homicidal thoughts you must seek medical attention immediately by calling 911 or calling your MD immediately  if symptoms less severe. ° °You Must read complete instructions/literature along with all the possible adverse reactions/side effects for all the Medicines you take and that have been prescribed to you. Take any new Medicines after you have completely understood and accpet all the possible adverse reactions/side effects.  ° °Please note ° °You were cared for by a hospitalist during your hospital stay. If you have any questions about your discharge medications or the care you received while you were in the hospital after you are discharged, you can call the unit and asked to speak with the hospitalist on call if the hospitalist that took care of you is not available. Once you are discharged, your primary care physician will handle any further medical issues. Please note that NO REFILLS for any discharge medications will be authorized once you are discharged, as it is imperative that you return to your primary care physician (or establish a relationship with a primary care physician if you do not have one) for your aftercare needs so that they can reassess your need for medications and monitor your lab values. ° ° °

## 2015-04-09 NOTE — Progress Notes (Signed)
MD order received in [facility] to discharge pt home today; verbally reviewed AVS with pt, no further questions voiced at this time; pt's discharge pending arrival of her ride home

## 2015-04-09 NOTE — Discharge Summary (Signed)
[facility] - [place] at [facility]   PATIENT NAME: Dawn Stein    MR#:  [id number]  DATE OF BIRTH:  1970-07-15  DATE OF ADMISSION:  04/08/2015 ADMITTING PHYSICIAN: Katha Hamming, MD  DATE OF DISCHARGE: 04/09/2015 10:02 AM  PRIMARY CARE PHYSICIAN: [facility]    ADMISSION DIAGNOSIS:  Slurred speech [R47.81] Stroke [facility]) [I63.9] Dysarthria [R47.1]  DISCHARGE DIAGNOSIS:  Active Problems:   Slurred speech   SECONDARY DIAGNOSIS:   Past Medical History  Diagnosis Date  . Seizures (HCC)   . Stroke [facility])     HOSPITAL COURSE:   44 year old female with past medical history of CVA, history of previous seizures, depression who presented to the hospital due to slurred speech and thought to have possible CVA.  #1 slurred speech-this was thought to be secondary to a possible CVA. Patient underwent extensive workup including a CT head, MRI of the brain which did not show any evidence of acute seizures but Old strokes. -Patient was seen by neurology and he thought this could be secondary to underlying seizure and therefore increased her Keppra dose. Presently patient is being discharged on this as her symptoms have improved and close follow-up with her neurologist in [place]. She was advised to not drive until she sees her neurologist.  #2 history of previous CVA-continue aspirin, statin.  #3 depression-continue Zoloft  DISCHARGE CONDITIONS:   Stable  CONSULTS OBTAINED:  Treatment Team:  Mellody Drown, MD  DRUG ALLERGIES:   Allergies  Allergen Reactions  . Tetracyclines & Related Anaphylaxis    Facial swelling    DISCHARGE MEDICATIONS:   Discharge Medication List as of 04/09/2015 10:15 AM    CONTINUE these medications which have CHANGED   Details  levETIRAcetam (KEPPRA) 750 MG tablet Take 2 tablets (1,500 mg total) by mouth 2 (two) times daily., Starting 04/09/2015, Until Discontinued, Normal      CONTINUE these  medications which have NOT CHANGED   Details  aspirin EC 81 MG tablet Take 81 mg by mouth daily., Until Discontinued, Historical Med    sertraline (ZOLOFT) 100 MG tablet Take 100 mg by mouth daily., Until Discontinued, Historical Med    simvastatin (ZOCOR) 40 MG tablet Take 40 mg by mouth at bedtime., Until Discontinued, Historical Med         DISCHARGE INSTRUCTIONS:   DIET:  Cardiac diet  DISCHARGE CONDITION:  Stable  ACTIVITY:  Activity as tolerated  OXYGEN:  Home Oxygen: No.   Oxygen Delivery: room air  DISCHARGE LOCATION:  home   If you experience worsening of your admission symptoms, develop shortness of breath, life threatening emergency, suicidal or homicidal thoughts you must seek medical attention immediately by calling 911 or calling your MD immediately  if symptoms less severe.  You Must read complete instructions/literature along with all the possible adverse reactions/side effects for all the Medicines you take and that have been prescribed to you. Take any new Medicines after you have completely understood and accpet all the possible adverse reactions/side effects.   Please note  You were cared for by a hospitalist during your hospital stay. If you have any questions about your discharge medications or the care you received while you were in the hospital after you are discharged, you can call the unit and asked to speak with the hospitalist on call if the hospitalist that took care of you is not available. Once you are discharged, your primary care physician will handle any further medical issues. Please note that NO  REFILLS for any discharge medications will be authorized once you are discharged, as it is imperative that you return to your primary care physician (or establish a relationship with a primary care physician if you do not have one) for your aftercare needs so that they can reassess your need for medications and monitor your lab values.     Today    No complaints presently. Speech back to normal. No other focal motor or sensory deficits  VITAL SIGNS:  Blood pressure 125/71, pulse 63, temperature 98.9 F (37.2 C), temperature source Oral, resp. rate 16, height  (1.575 m), weight 98.431 kg (217 lb), last menstrual period 04/08/2015, SpO2 100 %.  I/O:   Intake/Output Summary (Last 24 hours) at 04/09/15 1512 Last data filed at 04/08/15 1800  Gross per 24 hour  Intake    480 ml  Output      0 ml  Net    480 ml    PHYSICAL EXAMINATION:  GENERAL:  44 y.o.-year-old patient lying in the bed with no acute distress.  EYES: Pupils equal, round, reactive to light and accommodation. No scleral icterus. Extraocular muscles intact.  HEENT: Head atraumatic, normocephalic. Oropharynx and nasopharynx clear.  NECK:  Supple, no jugular venous distention. No thyroid enlargement, no tenderness.  LUNGS: Normal breath sounds bilaterally, no wheezing, rales,rhonchi. No use of accessory muscles of respiration.  CARDIOVASCULAR: S1, S2 normal. No murmurs, rubs, or gallops.  ABDOMEN: Soft, non-tender, non-distended. Bowel sounds present. No organomegaly or mass.  EXTREMITIES: No pedal edema, cyanosis, or clubbing.  NEUROLOGIC: Cranial nerves II through XII are intact. No focal motor or sensory defecits b/l.  PSYCHIATRIC: The patient is alert and oriented x 3. Good affect.  SKIN: No obvious rash, lesion, or ulcer.   DATA REVIEW:   CBC  Recent Labs Lab 04/09/15 0427  WBC 3.6  HGB 11.9*  HCT 37.1  PLT 81*    Chemistries   Recent Labs Lab 04/08/15 1047 04/09/15 0427  NA 141 143  K 4.1 3.9  CL 112* 113*  CO2 23 25  GLUCOSE 70 77  BUN 15 18  CREATININE 1.36* 1.24*  CALCIUM 8.1* 8.3*  AST 14*  --   ALT 9*  --   ALKPHOS 63  --   BILITOT 0.4  --     Cardiac Enzymes  Recent Labs Lab 04/08/15 1047  TROPONINI <0.03    Microbiology Results  Results for orders placed or performed in visit on 12/23/13  Culture, blood (single)      Status: None   Collection Time: 12/23/13  2:14 AM  Result Value Ref Range Status   Micro Text Report   Final       COMMENT                   NO GROWTH AEROBICALLY/ANAEROBICALLY IN 5 DAYS   ANTIBIOTIC                                                      Culture, blood (single)     Status: None   Collection Time: 12/23/13  2:15 AM  Result Value Ref Range Status   Micro Text Report   Final       COMMENT  NO GROWTH AEROBICALLY/ANAEROBICALLY IN 5 DAYS   ANTIBIOTIC                                                        RADIOLOGY:  Ct Head Wo Contrast  04/08/2015   CLINICAL DATA:  44 year old female with acute onset of slurred speech beginning at 09:15 today. Symptoms accompanied by dizziness  EXAM: CT HEAD WITHOUT CONTRAST  TECHNIQUE: Contiguous axial images were obtained from the base of the skull through the vertex without intravenous contrast.  COMPARISON:  Prior head CT 02/01/2014  FINDINGS: Small region of focal hypoattenuation of the cortex and immediate subcortical region in the posterior aspect of the left frontal lobe (image 24 series 2). This abnormality is visible largely on 1 image and may represent streak artifact. Remote prior infarcts in the right MCA and bilateral PCA territories. No evidence of mass, mass effect, intracranial hemorrhage or hydrocephalus. No focal soft tissue or calvarial abnormality. Visualized portions of the globes and orbits are unremarkable. Normal aeration of the visualized mastoid air cells and paranasal sinuses.  IMPRESSION: 1. Focal hypoattenuation of the posterior left frontal lobe cortex with blurring of the gray-white junction. This may represent a small region of acute ischemia, however the abnormality is at the in a region susceptible to both volume averaging and streak artifact. This finding could be artifactual. 2. Remote prior infarcts in the right MCA and bilateral PCA territories.  These results were called by telephone at the  time of interpretation on 04/08/2015 at 10:44 am to Dr. Sharyn Creamer , who verbally acknowledged these results.   Electronically Signed   By: Malachy Moan M.D.   On: 04/08/2015 10:45   Mr Brain Wo Contrast  04/08/2015   CLINICAL DATA:  Slurred speech. Weakness, dizziness, and ringing in the right ear. Symptoms have since subsided. Old stroke in 2010.  EXAM: MRI HEAD WITHOUT CONTRAST  TECHNIQUE: Multiplanar, multiecho pulse sequences of the brain and surrounding structures were obtained without intravenous contrast.  COMPARISON:  Head CT 04/08/2015 and MRI 03/31/2012  FINDINGS: A mildly expanded partially empty sella is again seen. There is no evidence of acute infarct, acute intracranial hemorrhage, mass, midline shift, or extra-axial fluid collection. There is mild global cerebral atrophy, advanced for age.  Chronic cortical infarcts are seen involving the right greater than left frontal lobes and left parietal lobe in the MCA territory as well as bilateral occipital lobes and posterior left temporal lobe. The left middle frontal gyrus infarct and posterior left temporal lobe infarct are new from the prior MRI, and there has also been progressive encephalomalacia in the right occipital lobe. Chronic bilateral cerebellar infarcts are again seen. There is a small amount of chronic blood products associated with the right frontal infarct.  Orbits are grossly unremarkable. Paranasal sinuses and mastoid air cells are clear. Major intracranial vascular flow voids are preserved.  IMPRESSION: 1. No acute intracranial abnormality. 2. Multiple chronic cerebral and cerebellar infarcts, progressed from 2013.   Electronically Signed   By: Sebastian Ache M.D.   On: 04/08/2015 19:42      Management plans discussed with the patient, family and they are in agreement.  CODE STATUS:   TOTAL TIME TAKING CARE OF THIS PATIENT: 40 minutes.    Houston Siren M.D on 04/09/2015 at 3:12 PM  Between  7am to 6pm - Pager -  [phone]  After 6pm go to [email or web address] - password [email or web address] [facility]  [place] [facility]  Office  [phone]  CC: Primary care physician; [facility]

## 2015-04-11 LAB — LEVETIRACETAM LEVEL: LEVETIRACETAM: 79.5 ug/mL — AB (ref 10.0–40.0)

## 2015-05-19 ENCOUNTER — Inpatient Hospital Stay: Payer: Medicare Other | Admitting: Internal Medicine

## 2016-03-29 ENCOUNTER — Other Ambulatory Visit: Payer: Self-pay | Admitting: Internal Medicine

## 2016-03-29 DIAGNOSIS — Z1231 Encounter for screening mammogram for malignant neoplasm of breast: Secondary | ICD-10-CM

## 2016-04-27 ENCOUNTER — Ambulatory Visit
Admission: RE | Admit: 2016-04-27 | Discharge: 2016-04-27 | Disposition: A | Discharge status: Home / Self Care | Payer: Medicare Other | Source: Ambulatory Visit | Attending: Internal Medicine | Admitting: Internal Medicine

## 2016-04-27 DIAGNOSIS — Z1231 Encounter for screening mammogram for malignant neoplasm of breast: Secondary | ICD-10-CM | POA: Insufficient documentation

## 2016-04-27 DIAGNOSIS — R928 Other abnormal and inconclusive findings on diagnostic imaging of breast: Secondary | ICD-10-CM | POA: Insufficient documentation

## 2016-05-08 ENCOUNTER — Other Ambulatory Visit: Payer: Self-pay | Admitting: Internal Medicine

## 2016-05-08 DIAGNOSIS — R928 Other abnormal and inconclusive findings on diagnostic imaging of breast: Secondary | ICD-10-CM

## 2016-05-08 DIAGNOSIS — N6489 Other specified disorders of breast: Secondary | ICD-10-CM

## 2016-05-16 ENCOUNTER — Ambulatory Visit: Payer: Medicare Other

## 2016-05-16 ENCOUNTER — Other Ambulatory Visit: Payer: Medicare Other

## 2016-06-04 ENCOUNTER — Ambulatory Visit: Payer: Medicare Other

## 2016-06-04 ENCOUNTER — Other Ambulatory Visit: Payer: Medicare Other

## 2016-07-16 ENCOUNTER — Other Ambulatory Visit: Payer: Medicare Other

## 2016-07-16 ENCOUNTER — Ambulatory Visit: Payer: Medicare Other | Attending: Internal Medicine

## 2016-10-25 ENCOUNTER — Inpatient Hospital Stay
Admission: EM | Admit: 2016-10-25 | Discharge: 2016-10-26 | DRG: 069 | Disposition: A | Discharge status: Home / Self Care | Payer: Medicare Other | Attending: Internal Medicine | Admitting: Internal Medicine

## 2016-10-25 ENCOUNTER — Emergency Department: Payer: Medicare Other

## 2016-10-25 ENCOUNTER — Encounter: Payer: Self-pay | Admitting: Emergency Medicine

## 2016-10-25 DIAGNOSIS — G40909 Epilepsy, unspecified, not intractable, without status epilepticus: Secondary | ICD-10-CM | POA: Diagnosis present

## 2016-10-25 DIAGNOSIS — R29810 Facial weakness: Secondary | ICD-10-CM | POA: Diagnosis present

## 2016-10-25 DIAGNOSIS — G8191 Hemiplegia, unspecified affecting right dominant side: Secondary | ICD-10-CM | POA: Diagnosis not present

## 2016-10-25 DIAGNOSIS — E860 Dehydration: Secondary | ICD-10-CM | POA: Diagnosis present

## 2016-10-25 DIAGNOSIS — D696 Thrombocytopenia, unspecified: Secondary | ICD-10-CM | POA: Diagnosis not present

## 2016-10-25 DIAGNOSIS — R4781 Slurred speech: Secondary | ICD-10-CM | POA: Diagnosis not present

## 2016-10-25 DIAGNOSIS — F17200 Nicotine dependence, unspecified, uncomplicated: Secondary | ICD-10-CM | POA: Diagnosis not present

## 2016-10-25 DIAGNOSIS — Z79899 Other long term (current) drug therapy: Secondary | ICD-10-CM

## 2016-10-25 DIAGNOSIS — Z820 Family history of epilepsy and other diseases of the nervous system: Secondary | ICD-10-CM | POA: Diagnosis not present

## 2016-10-25 DIAGNOSIS — Z7982 Long term (current) use of aspirin: Secondary | ICD-10-CM | POA: Diagnosis not present

## 2016-10-25 DIAGNOSIS — G459 Transient cerebral ischemic attack, unspecified: Secondary | ICD-10-CM | POA: Diagnosis not present

## 2016-10-25 DIAGNOSIS — R29701 NIHSS score 1: Secondary | ICD-10-CM | POA: Diagnosis present

## 2016-10-25 DIAGNOSIS — Z801 Family history of malignant neoplasm of trachea, bronchus and lung: Secondary | ICD-10-CM | POA: Diagnosis not present

## 2016-10-25 DIAGNOSIS — Z881 Allergy status to other antibiotic agents status: Secondary | ICD-10-CM

## 2016-10-25 DIAGNOSIS — Z7902 Long term (current) use of antithrombotics/antiplatelets: Secondary | ICD-10-CM | POA: Diagnosis not present

## 2016-10-25 DIAGNOSIS — F419 Anxiety disorder, unspecified: Secondary | ICD-10-CM | POA: Diagnosis not present

## 2016-10-25 DIAGNOSIS — I639 Cerebral infarction, unspecified: Secondary | ICD-10-CM | POA: Diagnosis present

## 2016-10-25 DIAGNOSIS — E785 Hyperlipidemia, unspecified: Secondary | ICD-10-CM | POA: Diagnosis present

## 2016-10-25 DIAGNOSIS — R531 Weakness: Secondary | ICD-10-CM | POA: Diagnosis present

## 2016-10-25 LAB — URINALYSIS, COMPLETE (UACMP) WITH MICROSCOPIC
BACTERIA UA: NONE SEEN
Bilirubin Urine: NEGATIVE
Glucose, UA: NEGATIVE mg/dL
Hgb urine dipstick: NEGATIVE
KETONES UR: 5 mg/dL — AB
Leukocytes, UA: NEGATIVE
Nitrite: NEGATIVE
PH: 6 (ref 5.0–8.0)
Protein, ur: NEGATIVE mg/dL
SPECIFIC GRAVITY, URINE: 1.027 (ref 1.005–1.030)

## 2016-10-25 LAB — CBC
HCT: 38.6 % (ref 35.0–47.0)
Hemoglobin: 12.7 g/dL (ref 12.0–16.0)
MCH: 26.8 pg (ref 26.0–34.0)
MCHC: 32.9 g/dL (ref 32.0–36.0)
MCV: 81.4 fL (ref 80.0–100.0)
PLATELETS: 92 10*3/uL — AB (ref 150–440)
RBC: 4.75 MIL/uL (ref 3.80–5.20)
RDW: 14.6 % — AB (ref 11.5–14.5)
WBC: 4.2 10*3/uL (ref 3.6–11.0)

## 2016-10-25 LAB — BASIC METABOLIC PANEL
Anion gap: 9 (ref 5–15)
BUN: 23 mg/dL — AB (ref 6–20)
CHLORIDE: 105 mmol/L (ref 101–111)
CO2: 24 mmol/L (ref 22–32)
CREATININE: 1.24 mg/dL — AB (ref 0.44–1.00)
Calcium: 8.4 mg/dL — ABNORMAL LOW (ref 8.9–10.3)
GFR calc Af Amer: 60 mL/min — ABNORMAL LOW (ref >60)
GFR calc non Af Amer: 52 mL/min — ABNORMAL LOW (ref >60)
Glucose, Bld: 103 mg/dL — ABNORMAL HIGH (ref 65–99)
Potassium: 4.5 mmol/L (ref 3.5–5.1)
Sodium: 138 mmol/L (ref 135–145)

## 2016-10-25 LAB — GLUCOSE, CAPILLARY: GLUCOSE-CAPILLARY: 86 mg/dL (ref 65–99)

## 2016-10-25 NOTE — ED Triage Notes (Signed)
Pt to triage per EMS, per pt she was driven home from work, once at home pt sts she became weak while walking up stairs, came into home where she could not stop drooling. Pt sts she has HX of strokes and seizers, compliant with meds, per pt (Keppra and Depakote.) Pt is poor historian intriage, pt denies all symptoms now. Pt sts "I just thought I was going to dye, I didn't know if I was gonna make it."

## 2016-10-25 NOTE — ED Notes (Signed)
Pt to STAT desk in lobby via w/c with no distress noted; EMS reports pt called due to sudden onset of "fear while getting out of the car", anxiety; also reports missed dose of depakote and has hx of seizure and denies any c/o at present and enroute

## 2016-10-26 ENCOUNTER — Observation Stay
Admit: 2016-10-26 | Discharge: 2016-10-26 | Disposition: A | Discharge status: Home / Self Care | Payer: Medicare Other | Attending: Internal Medicine | Admitting: Internal Medicine

## 2016-10-26 ENCOUNTER — Observation Stay: Payer: Medicare Other

## 2016-10-26 ENCOUNTER — Encounter: Payer: Self-pay | Admitting: Internal Medicine

## 2016-10-26 DIAGNOSIS — F17200 Nicotine dependence, unspecified, uncomplicated: Secondary | ICD-10-CM | POA: Diagnosis not present

## 2016-10-26 DIAGNOSIS — Z7902 Long term (current) use of antithrombotics/antiplatelets: Secondary | ICD-10-CM | POA: Diagnosis not present

## 2016-10-26 DIAGNOSIS — E860 Dehydration: Secondary | ICD-10-CM | POA: Diagnosis not present

## 2016-10-26 DIAGNOSIS — G459 Transient cerebral ischemic attack, unspecified: Principal | ICD-10-CM

## 2016-10-26 DIAGNOSIS — R4781 Slurred speech: Secondary | ICD-10-CM | POA: Diagnosis not present

## 2016-10-26 DIAGNOSIS — E785 Hyperlipidemia, unspecified: Secondary | ICD-10-CM | POA: Diagnosis not present

## 2016-10-26 DIAGNOSIS — Z79899 Other long term (current) drug therapy: Secondary | ICD-10-CM | POA: Diagnosis not present

## 2016-10-26 DIAGNOSIS — R29701 NIHSS score 1: Secondary | ICD-10-CM | POA: Diagnosis not present

## 2016-10-26 DIAGNOSIS — G8191 Hemiplegia, unspecified affecting right dominant side: Secondary | ICD-10-CM | POA: Diagnosis not present

## 2016-10-26 DIAGNOSIS — Z881 Allergy status to other antibiotic agents status: Secondary | ICD-10-CM | POA: Diagnosis not present

## 2016-10-26 DIAGNOSIS — G40909 Epilepsy, unspecified, not intractable, without status epilepticus: Secondary | ICD-10-CM | POA: Diagnosis not present

## 2016-10-26 DIAGNOSIS — Z801 Family history of malignant neoplasm of trachea, bronchus and lung: Secondary | ICD-10-CM | POA: Diagnosis not present

## 2016-10-26 DIAGNOSIS — Z820 Family history of epilepsy and other diseases of the nervous system: Secondary | ICD-10-CM | POA: Diagnosis not present

## 2016-10-26 DIAGNOSIS — F419 Anxiety disorder, unspecified: Secondary | ICD-10-CM | POA: Diagnosis not present

## 2016-10-26 DIAGNOSIS — R531 Weakness: Secondary | ICD-10-CM | POA: Diagnosis present

## 2016-10-26 DIAGNOSIS — I639 Cerebral infarction, unspecified: Secondary | ICD-10-CM | POA: Diagnosis present

## 2016-10-26 DIAGNOSIS — D696 Thrombocytopenia, unspecified: Secondary | ICD-10-CM | POA: Diagnosis not present

## 2016-10-26 DIAGNOSIS — Z7982 Long term (current) use of aspirin: Secondary | ICD-10-CM | POA: Diagnosis not present

## 2016-10-26 DIAGNOSIS — R29810 Facial weakness: Secondary | ICD-10-CM | POA: Diagnosis not present

## 2016-10-26 LAB — ECHOCARDIOGRAM COMPLETE
HEIGHTINCHES: 62 in
WEIGHTICAEL: 3409.6 [oz_av]

## 2016-10-26 LAB — LIPID PANEL
CHOL/HDL RATIO: 5.8 ratio
Cholesterol: 186 mg/dL (ref 0–200)
HDL: 32 mg/dL — ABNORMAL LOW (ref >40)
LDL Cholesterol: 137 mg/dL — ABNORMAL HIGH (ref 0–99)
Triglycerides: 83 mg/dL (ref <150)
VLDL: 17 mg/dL (ref 0–40)

## 2016-10-26 LAB — TROPONIN I

## 2016-10-26 LAB — VALPROIC ACID LEVEL: Valproic Acid Lvl: 88 ug/mL (ref 50.0–100.0)

## 2016-10-26 MED ORDER — ACETAMINOPHEN 160 MG/5ML PO SOLN: 650.0000 mg | ORAL | Status: DC | PRN

## 2016-10-26 MED ORDER — ASPIRIN 325 MG PO TABS
325.0000 mg | ORAL_TABLET | Freq: Every day | ORAL | Status: DC
  Administered 2016-10-26: 325 mg via ORAL
  Filled 2016-10-26: qty 1

## 2016-10-26 MED ORDER — ACETAMINOPHEN 325 MG PO TABS: 650.0000 mg | ORAL_TABLET | ORAL | Status: DC | PRN

## 2016-10-26 MED ORDER — SIMVASTATIN 20 MG PO TABS: 40.0000 mg | ORAL_TABLET | Freq: Every day | ORAL | Status: DC

## 2016-10-26 MED ORDER — SODIUM CHLORIDE 0.9 % IV SOLN
INTRAVENOUS | Status: DC
  Administered 2016-10-26: 03:00:00 via INTRAVENOUS

## 2016-10-26 MED ORDER — LEVETIRACETAM 500 MG PO TABS: 1500.0000 mg | ORAL_TABLET | Freq: Every day | ORAL | Status: DC

## 2016-10-26 MED ORDER — SERTRALINE HCL 50 MG PO TABS
100.0000 mg | ORAL_TABLET | Freq: Every day | ORAL | Status: DC
  Administered 2016-10-26: 100 mg via ORAL
  Filled 2016-10-26: qty 2

## 2016-10-26 MED ORDER — ACETAMINOPHEN 650 MG RE SUPP: 650.0000 mg | RECTAL | Status: DC | PRN

## 2016-10-26 MED ORDER — LEVETIRACETAM 750 MG PO TABS
750.0000 mg | ORAL_TABLET | Freq: Every day | ORAL | Status: DC
  Administered 2016-10-26: 750 mg via ORAL
  Filled 2016-10-26: qty 1

## 2016-10-26 MED ORDER — CLOPIDOGREL BISULFATE 75 MG PO TABS
75.0000 mg | ORAL_TABLET | Freq: Every day | ORAL | Status: DC
  Administered 2016-10-26: 13:00:00 75 mg via ORAL
  Filled 2016-10-26: qty 1

## 2016-10-26 MED ORDER — LEVETIRACETAM 750 MG PO TABS: 750.0000 mg | ORAL_TABLET | ORAL | 1 refills | Status: DC

## 2016-10-26 MED ORDER — SERTRALINE HCL 50 MG PO TABS: 50.0000 mg | ORAL_TABLET | Freq: Every day | ORAL | Status: DC

## 2016-10-26 MED ORDER — DIVALPROEX SODIUM 500 MG PO DR TAB
500.0000 mg | DELAYED_RELEASE_TABLET | Freq: Two times a day (BID) | ORAL | Status: DC
  Administered 2016-10-26: 500 mg via ORAL
  Filled 2016-10-26 (×2): qty 1

## 2016-10-26 MED ORDER — LEVETIRACETAM 750 MG PO TABS: 1500.0000 mg | ORAL_TABLET | Freq: Every day | ORAL | 0 refills | Status: DC

## 2016-10-26 MED ORDER — STROKE: EARLY STAGES OF RECOVERY BOOK
Freq: Once | Status: AC
  Administered 2016-10-26: 03:00:00 1

## 2016-10-26 MED ORDER — LEVETIRACETAM 500 MG PO TABS
1500.0000 mg | ORAL_TABLET | Freq: Two times a day (BID) | ORAL | Status: DC
  Administered 2016-10-26: 08:00:00 1500 mg via ORAL
  Filled 2016-10-26 (×2): qty 3

## 2016-10-26 MED ORDER — LEVETIRACETAM 750 MG PO TABS: 750.0000 mg | ORAL_TABLET | ORAL | Status: DC

## 2016-10-26 MED ORDER — SENNOSIDES-DOCUSATE SODIUM 8.6-50 MG PO TABS: 1.0000 | ORAL_TABLET | Freq: Every evening | ORAL | Status: DC | PRN

## 2016-10-26 MED ORDER — ATORVASTATIN CALCIUM 20 MG PO TABS: 40.0000 mg | ORAL_TABLET | Freq: Every day | ORAL | Status: DC

## 2016-10-26 MED ORDER — ASPIRIN 300 MG RE SUPP: 300.0000 mg | Freq: Every day | RECTAL | Status: DC

## 2016-10-26 NOTE — H&P (Signed)
[facility] - [place] at [facility]   PATIENT NAME: Dawn Stein    MR#:  [id number]  DATE OF BIRTH:  1971-05-04  DATE OF ADMISSION:  10/25/2016  PRIMARY CARE PHYSICIAN: [facility]   REQUESTING/REFERRING PHYSICIAN:   CHIEF COMPLAINT:   Chief Complaint  Patient presents with  . Weakness    HISTORY OF PRESENT ILLNESS: Dawn Stein  is a 46 y.o. female with a known history of Seizure disorder, CVA in the past presented to the emergency room after she came home from work she became weak. She had drooling on the right side of the face and she also had some weakness in the right side of the body. This happened around 8:30 PM yesterday evening. Patient had history of multiple strokes in the past and currently on seizure medication. Patient says she also had some slurred speech and the above symptoms occurred. Patient came to the emergency room she was evaluated for speech improved and her weakness on the right side is also resolved. Patient felt much better in the emergency room. CT head did not reveal any acute intracranial abnormality. No complaints of any chest pain and shortness of breath. No history of headache dizziness or blurry vision. Hospitalist service was consulted for further care of the patient.  PAST MEDICAL HISTORY:   Past Medical History:  Diagnosis Date  . Seizures (HCC)   . Stroke [facility])     PAST SURGICAL HISTORY: Past Surgical History:  Procedure Laterality Date  . none      SOCIAL HISTORY:  Social History  Substance Use Topics  . Smoking status: Current Every Day Smoker  . Smokeless tobacco: Never Used  . Alcohol use No    FAMILY HISTORY:  Family History  Problem Relation Age of Onset  . Seizures Mother   . Lung cancer Maternal Grandfather     DRUG ALLERGIES:  Allergies  Allergen Reactions  . Tetracyclines & Related Anaphylaxis    Facial swelling    REVIEW OF SYSTEMS:   CONSTITUTIONAL: No fever, fatigue or  weakness.  EYES: No blurred or double vision.  EARS, NOSE, AND THROAT: No tinnitus or ear pain.  RESPIRATORY: No cough, shortness of breath, wheezing or hemoptysis.  CARDIOVASCULAR: No chest pain, orthopnea, edema.  GASTROINTESTINAL: No nausea, vomiting, diarrhea or abdominal pain.  GENITOURINARY: No dysuria, hematuria.  ENDOCRINE: No polyuria, nocturia,  HEMATOLOGY: No anemia, easy bruising or bleeding SKIN: No rash or lesion. MUSCULOSKELETAL: No joint pain or arthritis.   NEUROLOGIC: No tingling, numbness, right sided weakness initially. Slurred speech initially  PSYCHIATRY: No anxiety or depression.   MEDICATIONS AT HOME:  Prior to Admission medications   Medication Sig Start Date End Date Taking? Authorizing Provider  aspirin EC 81 MG tablet Take 81 mg by mouth daily.    Historical Provider, MD  levETIRAcetam (KEPPRA) 750 MG tablet Take 2 tablets (1,500 mg total) by mouth 2 (two) times daily. 04/09/15   Houston Siren, MD  sertraline (ZOLOFT) 100 MG tablet Take 100 mg by mouth daily.    Historical Provider, MD  simvastatin (ZOCOR) 40 MG tablet Take 40 mg by mouth at bedtime.    Historical Provider, MD      PHYSICAL EXAMINATION:   VITAL SIGNS: Blood pressure (!) 142/77, pulse 64, temperature 98 F (36.7 C), resp. rate 13, height  (1.575 m), weight 97.5 kg (215 lb), SpO2 100 %.  GENERAL:  46 y.o.-year-old patient lying in the bed with no acute distress.  EYES: Pupils equal, round, reactive to light and accommodation. No scleral icterus. Extraocular muscles intact.  HEENT: Head atraumatic, normocephalic. Oropharynx and nasopharynx clear.  NECK:  Supple, no jugular venous distention. No thyroid enlargement, no tenderness.  LUNGS: Normal breath sounds bilaterally, no wheezing, rales,rhonchi or crepitation. No use of accessory muscles of respiration.  CARDIOVASCULAR: S1, S2 normal. No murmurs, rubs, or gallops.  ABDOMEN: Soft, nontender, nondistended. Bowel sounds present. No  organomegaly or mass.  EXTREMITIES: No pedal edema, cyanosis, or clubbing.  NEUROLOGIC: Cranial nerves II through XII are intact. Muscle strength 5/5 in all extremities. Sensation intact. Gait not checked.  PSYCHIATRIC: The patient is alert and oriented x 3.  SKIN: No obvious rash, lesion, or ulcer.   LABORATORY PANEL:   CBC  Recent Labs Lab 10/25/16 2153  WBC 4.2  HGB 12.7  HCT 38.6  PLT 92*  MCV 81.4  MCH 26.8  MCHC 32.9  RDW 14.6*   ------------------------------------------------------------------------------------------------------------------  Chemistries   Recent Labs Lab 10/25/16 2153  NA 138  K 4.5  CL 105  CO2 24  GLUCOSE 103*  BUN 23*  CREATININE 1.24*  CALCIUM 8.4*   ------------------------------------------------------------------------------------------------------------------ estimated creatinine clearance is 62.5 mL/min (A) (by C-G formula based on SCr of 1.24 mg/dL (H)). ------------------------------------------------------------------------------------------------------------------ No results for input(s): TSH, T4TOTAL, T3FREE, THYROIDAB in the last 72 hours.  Invalid input(s): FREET3   Coagulation profile No results for input(s): INR, PROTIME in the last 168 hours. ------------------------------------------------------------------------------------------------------------------- No results for input(s): DDIMER in the last 72 hours. -------------------------------------------------------------------------------------------------------------------  Cardiac Enzymes  Recent Labs Lab 10/26/16 0026  TROPONINI <0.03   ------------------------------------------------------------------------------------------------------------------ Invalid input(s): POCBNP  ---------------------------------------------------------------------------------------------------------------  Urinalysis    Component Value Date/Time   COLORURINE YELLOW (A)  10/25/2016 2153   APPEARANCEUR CLEAR (A) 10/25/2016 2153   APPEARANCEUR Clear 02/01/2014 2340   LABSPEC 1.027 10/25/2016 2153   LABSPEC 1.012 02/01/2014 2340   PHURINE 6.0 10/25/2016 2153   GLUCOSEU NEGATIVE 10/25/2016 2153   GLUCOSEU Negative 02/01/2014 2340   HGBUR NEGATIVE 10/25/2016 2153   BILIRUBINUR NEGATIVE 10/25/2016 2153   BILIRUBINUR Negative 02/01/2014 2340   KETONESUR 5 (A) 10/25/2016 2153   PROTEINUR NEGATIVE 10/25/2016 2153   NITRITE NEGATIVE 10/25/2016 2153   LEUKOCYTESUR NEGATIVE 10/25/2016 2153   LEUKOCYTESUR Negative 02/01/2014 2340     RADIOLOGY: Ct Head Wo Contrast  Result Date: 10/25/2016 CLINICAL DATA:  Seizures and multiple prior strokes EXAM: CT HEAD WITHOUT CONTRAST TECHNIQUE: Contiguous axial images were obtained from the base of the skull through the vertex without intravenous contrast. COMPARISON:  Brain MRI 04/08/2015 FINDINGS: Brain: There are multiple old infarcts of the right frontal lobe, left frontal lobe, right occipital lobe, left parietal lobe, left occipital lobe and right cerebellum. No intracranial hemorrhage. No midline shift or mass effect. Dilated ventricles are unchanged, secondary to atrophy. Vascular: No hyperdense vessel or unexpected calcification. Skull: Normal visualized skull base, calvarium and extracranial soft tissues. Sinuses/Orbits: No sinus fluid levels or advanced mucosal thickening. No mastoid effusion. Normal orbits. IMPRESSION: 1. Multiple old infarcts with associated encephalomalacia. No CT evidence of acute cortical infarct. 2. No intracranial hemorrhage. 3. Age advanced atrophy, unchanged. Electronically Signed   By: Deatra Robinson M.D.   On: 10/25/2016 23:52    EKG: Orders placed or performed during the hospital encounter of 10/25/16  . ED EKG  . ED EKG    IMPRESSION AND PLAN: 46 year old female patient with history of multiple CVAs in the past, seizure disorder presented to the emergency room with difficulty in  speech,  weakness in the right side of the body and drooling on the right side. Symptoms resolved after presentation to the emergency room. Admitting diagnosis 1. Transient ischemic attack 2. Seizure disorder 3. Mild thrombocytopenia 4.Dehydration 5. History of CVA Treatment plan Admit patient to observation bed with cardiac monitoring Start patient on oral aspirin Resume statin medication seizure medications Check MRI brain, carotid ultrasound and echocardiogram Neurology consultation IV fluids Supportive care  All the records are reviewed and case discussed with ED provider. Management plans discussed with the patient, family and they are in agreement.  CODE STATUS:FULL CODE Code Status History    Date Active Date Inactive Code Status Order ID Comments User Context   04/08/2015  1:34 PM 04/09/2015  2:10 PM Full Code [id number]  Katha Hamming, MD ED       TOTAL TIME TAKING CARE OF THIS PATIENT: 50 minutes.    Ihor Austin M.D on 10/26/2016 at 2:19 AM  Between 7am to 6pm - Pager - [phone]  After 6pm go to [email or web address] - password [email or web address] [facility]  [place] [facility]  Office  [phone]  CC: Primary care physician; [facility]

## 2016-10-26 NOTE — Progress Notes (Signed)
*  PRELIMINARY RESULTS* Echocardiogram 2D Echocardiogram has been performed.  Dawn Stein 10/26/2016, 12:13 PM

## 2016-10-26 NOTE — Discharge Summary (Signed)
[facility] - [place] at [facility]   PATIENT NAME: Dawn Stein    MR#:  [id number]  DATE OF BIRTH:  [birth year]  DATE OF ADMISSION:  10/25/2016 ADMITTING PHYSICIAN: Ihor Austin, MD  DATE OF DISCHARGE: 10/26/2016  PRIMARY CARE PHYSICIAN: [facility]    ADMISSION DIAGNOSIS:  anxiety  DISCHARGE DIAGNOSIS:  TIA   SECONDARY DIAGNOSIS:   Past Medical History:  Diagnosis Date  . Seizures (HCC)   . Stroke [facility])     HOSPITAL COURSE:  Dawn Stein  is a 46 y.o. female with a known history of Seizure disorder, CVA in the past presented to the emergency room after she came home from work she became weak. She had drooling on the right side of the face and she also had some weakness in the right side of the body. This happened around 8:30 PM yesterday evening  1. TIA -Patient presented with right facial droop and weakness on the right of the body. Symptoms resolved.-She remained in sinus rhythm. -Continue aspirin and Plavix. -Seen by neurology consult appreciated -MRI brain does not show any new stroke. Positive for remote strokes in the cerebellar area -Physical therapy to see patient  2. History of seizure disorder  -Continue Keppra. Patient reports feeling very sleepy during daytime hence following recommendation by neurology changed to 750 8 in the morning, 750 at noon, 1500 mg at bedtime  3. Hyperlipidemia continue statin  4. DVT prophylaxis Lovenox  Overall feels better. We'll discharge patient home when seen by PT  CONSULTS OBTAINED:  Treatment Team:  Thana Farr, MD  DRUG ALLERGIES:   Allergies  Allergen Reactions  . Tetracyclines & Related Anaphylaxis    Facial swelling    DISCHARGE MEDICATIONS:   Current Discharge Medication List    CONTINUE these medications which have CHANGED   Details  !! levETIRAcetam (KEPPRA) 750 MG tablet Take 1 tablet (750 mg total) by mouth 2 (two) times daily at 8 am and 1 pm. Qty:  120 tablet, Refills: 1    !! levETIRAcetam (KEPPRA) 750 MG tablet Take 2 tablets (1,500 mg total) by mouth at bedtime. Qty: 60 tablet, Refills: 0     !! - Potential duplicate medications found. Please discuss with provider.    CONTINUE these medications which have NOT CHANGED   Details  aspirin EC 81 MG tablet Take 81 mg by mouth daily.    atorvastatin (LIPITOR) 10 MG tablet Take 10 mg by mouth daily.    clopidogrel (PLAVIX) 75 MG tablet Take 75 mg by mouth daily.    divalproex (DEPAKOTE ER) 250 MG 24 hr tablet Take 250 mg by mouth 4 (four) times daily.    sertraline (ZOLOFT) 50 MG tablet Take 50 mg by mouth daily.         If you experience worsening of your admission symptoms, develop shortness of breath, life threatening emergency, suicidal or homicidal thoughts you must seek medical attention immediately by calling 911 or calling your MD immediately  if symptoms less severe.  You Must read complete instructions/literature along with all the possible adverse reactions/side effects for all the Medicines you take and that have been prescribed to you. Take any new Medicines after you have completely understood and accept all the possible adverse reactions/side effects.   Please note  You were cared for by a hospitalist during your hospital stay. If you have any questions about your discharge medications or the care you received while you were in the hospital after you  are discharged, you can call the unit and asked to speak with the hospitalist on call if the hospitalist that took care of you is not available. Once you are discharged, your primary care physician will handle any further medical issues. Please note that NO REFILLS for any discharge medications will be authorized once you are discharged, as it is imperative that you return to your primary care physician (or establish a relationship with a primary care physician if you do not have one) for your aftercare needs so that they can  reassess your need for medications and monitor your lab values. Today   SUBJECTIVE   Facial droop and weakness improved  VITAL SIGNS:  Blood pressure (!) 93/57, pulse (!) 57, temperature 97.9 F (36.6 C), temperature source Oral, resp. rate 18, height  (1.575 m), weight 96.7 kg (213 lb 1.6 oz), SpO2 100 %.  I/O:   Intake/Output Summary (Last 24 hours) at 10/26/16 1347 Last data filed at 10/26/16 1002  Gross per 24 hour  Intake            437.5 ml  Output                0 ml  Net            437.5 ml    PHYSICAL EXAMINATION:  GENERAL:  46 y.o.-year-old patient lying in the bed with no acute distress.  EYES: Pupils equal, round, reactive to light and accommodation. No scleral icterus. Extraocular muscles intact.  HEENT: Head atraumatic, normocephalic. Oropharynx and nasopharynx clear.  NECK:  Supple, no jugular venous distention. No thyroid enlargement, no tenderness.  LUNGS: Normal breath sounds bilaterally, no wheezing, rales,rhonchi or crepitation. No use of accessory muscles of respiration.  CARDIOVASCULAR: S1, S2 normal. No murmurs, rubs, or gallops.  ABDOMEN: Soft, non-tender, non-distended. Bowel sounds present. No organomegaly or mass.  EXTREMITIES: No pedal edema, cyanosis, or clubbing.  NEUROLOGIC: Cranial nerves II through XII are intact. Muscle strength 5/5 in all extremities. Sensation intact. Gait not checked.  PSYCHIATRIC: The patient is alert and oriented x 3.  SKIN: No obvious rash, lesion, or ulcer.   DATA REVIEW:   CBC   Recent Labs Lab 10/25/16 2153  WBC 4.2  HGB 12.7  HCT 38.6  PLT 92*    Chemistries   Recent Labs Lab 10/25/16 2153  NA 138  K 4.5  CL 105  CO2 24  GLUCOSE 103*  BUN 23*  CREATININE 1.24*  CALCIUM 8.4*    Microbiology Results   No results found for this or any previous visit (from the past 240 hour(s)).  RADIOLOGY:  Ct Head Wo Contrast  Result Date: 10/25/2016 CLINICAL DATA:  Seizures and multiple prior strokes  EXAM: CT HEAD WITHOUT CONTRAST TECHNIQUE: Contiguous axial images were obtained from the base of the skull through the vertex without intravenous contrast. COMPARISON:  Brain MRI 04/08/2015 FINDINGS: Brain: There are multiple old infarcts of the right frontal lobe, left frontal lobe, right occipital lobe, left parietal lobe, left occipital lobe and right cerebellum. No intracranial hemorrhage. No midline shift or mass effect. Dilated ventricles are unchanged, secondary to atrophy. Vascular: No hyperdense vessel or unexpected calcification. Skull: Normal visualized skull base, calvarium and extracranial soft tissues. Sinuses/Orbits: No sinus fluid levels or advanced mucosal thickening. No mastoid effusion. Normal orbits. IMPRESSION: 1. Multiple old infarcts with associated encephalomalacia. No CT evidence of acute cortical infarct. 2. No intracranial hemorrhage. 3. Age advanced atrophy, unchanged. Electronically Signed   By: Caryn Bee  Chase Picket M.D.   On: 10/25/2016 23:52   Mr Brain Wo Contrast  Result Date: 10/26/2016 CLINICAL DATA:  TIA. History of seizure disorder. Weakness in the right body with drooling beginning last night. Symptoms are improved or resolved EXAM: MRI HEAD WITHOUT CONTRAST MRA HEAD WITHOUT CONTRAST TECHNIQUE: Multiplanar, multiecho pulse sequences of the brain and surrounding structures were obtained without intravenous contrast. Angiographic images of the head were obtained using MRA technique without contrast. COMPARISON:  Head CT from yesterday.  Brain MRI 04/08/2015 FINDINGS: MRI HEAD FINDINGS Brain: No acute infarction, hemorrhage, hydrocephalus, extra-axial collection or mass lesion. Multiple remote small and moderate-sized infarcts, present in the bilateral occipital, bilateral frontal, and left parietal cortex. Multiple small remote bilateral cerebellar infarcts, more numerous on the right. Low cerebral volume for age, most notable at the upper convexity sulci. Given the degree of ischemic  injury for age, question if there is a underlying inflammatory or hereditary vasculopathy. Gliosis in the white matter as seen with chronic microvascular ischemia is minimal to none. No generalized chronic ischemic injury. Partially empty sella appearance, stable. Vascular: Arterial findings below. Normal dural venous sinus flow voids Skull and upper cervical spine: Negative Sinuses/Orbits: Negative MRA HEAD FINDINGS Fairly symmetric carotid and vertebral arteries. Incomplete circle-of-Willis at the left posterior communicating artery. There is atheromatous type irregularity of bilateral M2 branches and moderate narrowing at the left P1 segment. Poor signal in the left PCA territory, which may be due to this stenosis or downstream infarcts. No major branch occlusion. Small infundibulum at the right posterior communicating artery origin. Negative for aneurysm. No generalized vessel beading. IMPRESSION: 1. No acute finding. 2. Numerous remote PCA and MCA branch infarcts bilaterally. Multiple small remote bilateral cerebellar infarcts. 3. No acute finding on intracranial MRA. 4. Intracranial atherosclerosis with moderate left P1 segment stenosis. Electronically Signed   By: Marnee Spring M.D.   On: 10/26/2016 11:17   US Carotid Bilateral (at [facility] Only)  Result Date: 10/26/2016 CLINICAL DATA:  TIA. EXAM: BILATERAL CAROTID DUPLEX ULTRASOUND TECHNIQUE: Wallace Cullens scale imaging, color Doppler and duplex ultrasound were performed of bilateral carotid and vertebral arteries in the neck. COMPARISON:  07/06/2015 FINDINGS: Criteria: Quantification of carotid stenosis is based on velocity parameters that correlate the residual internal carotid diameter with NASCET-based stenosis levels, using the diameter of the distal internal carotid lumen as the denominator for stenosis measurement. The following velocity measurements were obtained: RIGHT ICA:  86 cm/sec CCA:  73 cm/sec SYSTOLIC ICA/CCA RATIO:  1.2 DIASTOLIC ICA/CCA  RATIO:  1.9 ECA:  60 cm/sec LEFT ICA:  87 cm/sec CCA:  97 cm/sec SYSTOLIC ICA/CCA RATIO:  0.9 DIASTOLIC ICA/CCA RATIO:  1.2 ECA:  77 cm/sec RIGHT CAROTID ARTERY: Minimal plaque at the right carotid bulb. External carotid artery is patent with normal waveform. Normal waveforms and velocities in the internal carotid artery. RIGHT VERTEBRAL ARTERY: Antegrade flow and normal waveform in the right vertebral artery. LEFT CAROTID ARTERY: Left carotid arteries are patent without significant plaque or stenosis. Normal waveforms and velocities in the internal carotid artery. External carotid artery is patent with normal waveform. LEFT VERTEBRAL ARTERY: Antegrade flow and normal waveform in the left vertebral artery. IMPRESSION: Minimal plaque at the right carotid bulb. Carotid arteries are widely patent without significant stenosis. Estimated degree of stenosis in the internal carotid arteries is less than 50% bilaterally. Patent vertebral arteries with antegrade flow. Electronically Signed   By: Richarda Overlie M.D.   On: 10/26/2016 12:54   Mr Maxine Glenn Head/brain ZO  Cm  Result Date: 10/26/2016 CLINICAL DATA:  TIA. History of seizure disorder. Weakness in the right body with drooling beginning last night. Symptoms are improved or resolved EXAM: MRI HEAD WITHOUT CONTRAST MRA HEAD WITHOUT CONTRAST TECHNIQUE: Multiplanar, multiecho pulse sequences of the brain and surrounding structures were obtained without intravenous contrast. Angiographic images of the head were obtained using MRA technique without contrast. COMPARISON:  Head CT from yesterday.  Brain MRI 04/08/2015 FINDINGS: MRI HEAD FINDINGS Brain: No acute infarction, hemorrhage, hydrocephalus, extra-axial collection or mass lesion. Multiple remote small and moderate-sized infarcts, present in the bilateral occipital, bilateral frontal, and left parietal cortex. Multiple small remote bilateral cerebellar infarcts, more numerous on the right. Low cerebral volume for age, most  notable at the upper convexity sulci. Given the degree of ischemic injury for age, question if there is a underlying inflammatory or hereditary vasculopathy. Gliosis in the white matter as seen with chronic microvascular ischemia is minimal to none. No generalized chronic ischemic injury. Partially empty sella appearance, stable. Vascular: Arterial findings below. Normal dural venous sinus flow voids Skull and upper cervical spine: Negative Sinuses/Orbits: Negative MRA HEAD FINDINGS Fairly symmetric carotid and vertebral arteries. Incomplete circle-of-Willis at the left posterior communicating artery. There is atheromatous type irregularity of bilateral M2 branches and moderate narrowing at the left P1 segment. Poor signal in the left PCA territory, which may be due to this stenosis or downstream infarcts. No major branch occlusion. Small infundibulum at the right posterior communicating artery origin. Negative for aneurysm. No generalized vessel beading. IMPRESSION: 1. No acute finding. 2. Numerous remote PCA and MCA branch infarcts bilaterally. Multiple small remote bilateral cerebellar infarcts. 3. No acute finding on intracranial MRA. 4. Intracranial atherosclerosis with moderate left P1 segment stenosis. Electronically Signed   By: Marnee Spring M.D.   On: 10/26/2016 11:17     Management plans discussed with the patient, family and they are in agreement.  CODE STATUS:     Code Status Orders        Start     Ordered   10/26/16 0258  Full code  Continuous     10/26/16 0257    Code Status History    Date Active Date Inactive Code Status Order ID Comments User Context   04/08/2015  1:34 PM 04/09/2015  2:10 PM Full Code [id number]  Katha Hamming, MD ED      TOTAL TIME TAKING CARE OF THIS PATIENT: 40 minutes.    Erminia Mcnew M.D on 10/26/2016 at 1:47 PM  Between 7am to 6pm - Pager - [phone] After 6pm go to [email or web address] - Social research officer, government  [facility]  Office   [phone]  CC: Primary care physician; [facility]

## 2016-10-26 NOTE — ED Provider Notes (Signed)
[facility] Emergency Department Provider Note   ____________________________________________   First MD Initiated Contact with Patient 10/26/16 0004     (approximate)  I have reviewed the triage vital signs and the nursing notes.   HISTORY  Chief Complaint Weakness   HPI Dawn Stein is a 46 y.o. female history of multiple strokes with right arm and left leg weakness but no acute strokes form none number of years. She was at home today and had an episode where she began drooling and having slurry speech slurred speech as reported by her family who came to her assistance. Eyes any new shortness of breath headaches or any other complaints. She has no new numbness or weakness.  Past Medical History:  Diagnosis Date  . Seizures (HCC)   . Stroke [facility])     Patient Active Problem List   Diagnosis Date Noted  . TIA (transient ischemic attack) 10/26/2016  . CVA (cerebral vascular accident) (HCC) 10/26/2016  . Slurred speech 04/08/2015    Past Surgical History:  Procedure Laterality Date  . none      Prior to Admission medications   Medication Sig Start Date End Date Taking? Authorizing Provider  aspirin EC 81 MG tablet Take 81 mg by mouth daily.   Yes Historical Provider, MD  atorvastatin (LIPITOR) 10 MG tablet Take 10 mg by mouth daily. 09/03/16 09/03/17 Yes Historical Provider, MD  clopidogrel (PLAVIX) 75 MG tablet Take 75 mg by mouth daily. 09/03/16 09/03/17 Yes Historical Provider, MD  divalproex (DEPAKOTE ER) 250 MG 24 hr tablet Take 250 mg by mouth 4 (four) times daily.   Yes Historical Provider, MD  sertraline (ZOLOFT) 50 MG tablet Take 50 mg by mouth daily.    Yes Historical Provider, MD  levETIRAcetam (KEPPRA) 750 MG tablet Take 1 tablet (750 mg total) by mouth 2 (two) times daily at 8 am and 1 pm. 10/26/16   Enedina Finner, MD  levETIRAcetam (KEPPRA) 750 MG tablet Take 2 tablets (1,500 mg total) by mouth at bedtime. 10/26/16   Enedina Finner, MD     Allergies Tetracyclines & related  Family History  Problem Relation Age of Onset  . Seizures Mother   . Lung cancer Maternal Grandfather     Social History Social History  Substance Use Topics  . Smoking status: Current Every Day Smoker  . Smokeless tobacco: Never Used  . Alcohol use No    Review of Systems  Constitutional: No fever/chills Eyes: No visual changes. ENT: No sore throat. Cardiovascular: Denies chest pain. Respiratory: Denies New shortness of breath. Gastrointestinal: No abdominal pain.  No nausea, no vomiting.  No diarrhea.  No constipation. Genitourinary: Negative for dysuria. Musculoskeletal: Negative for back pain. Skin: Negative for rash. Neurological: Negative for headaches, focal weakness or numbness.   ____________________________________________   PHYSICAL EXAM:  VITAL SIGNS: ED Triage Vitals [10/25/16 2143]  Enc Vitals Group     BP 123/62     Pulse Rate 68     Resp 16     Temp 98.6 F (37 C)     Temp Source Oral     SpO2 96 %     Weight 215 lb (97.5 kg)     Height  (1.575 m)     Head Circumference      Peak Flow      Pain Score      Pain Loc      Pain Edu?      Excl. in GC?  Constitutional: Alert and oriented. Well appearing and in no acute distress. Eyes: Conjunctivae are normal. PERRL. EOMI. Head: Atraumatic. Nose: No congestion/rhinnorhea. Mouth/Throat: Mucous membranes are moist.  Oropharynx non-erythematous. Neck: No stridor.   Cardiovascular: Normal rate, regular rhythm. Grossly normal heart sounds.  Good peripheral circulation. Respiratory: Normal respiratory effort.  No retractions. Lungs CTAB. Gastrointestinal: Soft and nontender. No distention. No abdominal bruits. No CVA tenderness. Musculoskeletal: No lower extremity tenderness nor edema.  No joint effusions. Neurologic:  Normal speech and language. No New gross focal neurologic deficits are appreciated. Bilateral field cut in her vision but this is old.  Cranial nerves II through XII are otherwise normal motor strength to me seems to be 5 over 5 throughout. Patient reports old right arm and left leg weakness. Finger to nose and rapid alternating movements and hands are normal. Skin:  Skin is warm, dry and intact. No rash noted.   ____________________________________________   LABS (all labs ordered are listed, but only abnormal results are displayed)  Labs Reviewed  BASIC METABOLIC PANEL - Abnormal; Notable for the following:       Result Value   Glucose, Bld 103 (*)    BUN 23 (*)    Creatinine, Ser 1.24 (*)    Calcium 8.4 (*)    GFR calc non Af Amer 52 (*)    GFR calc Af Amer 60 (*)    All other components within normal limits  CBC - Abnormal; Notable for the following:    RDW 14.6 (*)    Platelets 92 (*)    All other components within normal limits  URINALYSIS, COMPLETE (UACMP) WITH MICROSCOPIC - Abnormal; Notable for the following:    Color, Urine YELLOW (*)    APPearance CLEAR (*)    Ketones, ur 5 (*)    Squamous Epithelial / LPF 0-5 (*)    All other components within normal limits  LIPID PANEL - Abnormal; Notable for the following:    HDL 32 (*)    LDL Cholesterol 137 (*)    All other components within normal limits  GLUCOSE, CAPILLARY  VALPROIC ACID LEVEL  TROPONIN I  HEMOGLOBIN A1C  CBG MONITORING, ED   ____________________________________________  EKG   ____________________________________________  RADIOLOGY Study Result   CLINICAL DATA:  Seizures and multiple prior strokes  EXAM: CT HEAD WITHOUT CONTRAST  TECHNIQUE: Contiguous axial images were obtained from the base of the skull through the vertex without intravenous contrast.  COMPARISON:  Brain MRI 04/08/2015  FINDINGS: Brain: There are multiple old infarcts of the right frontal lobe, left frontal lobe, right occipital lobe, left parietal lobe, left occipital lobe and right cerebellum. No intracranial hemorrhage. No midline shift or mass  effect. Dilated ventricles are unchanged, secondary to atrophy.  Vascular: No hyperdense vessel or unexpected calcification.  Skull: Normal visualized skull base, calvarium and extracranial soft tissues.  Sinuses/Orbits: No sinus fluid levels or advanced mucosal thickening. No mastoid effusion. Normal orbits.  IMPRESSION: 1. Multiple old infarcts with associated encephalomalacia. No CT evidence of acute cortical infarct. 2. No intracranial hemorrhage. 3. Age advanced atrophy, unchanged.   Electronically Signed   By: Deatra Robinson M.D.   On: 10/25/2016 23:52     ____________________________________________   PROCEDURES  Procedure(s) performed:   Procedures  Critical Care performed:  ____________________________________________   INITIAL IMPRESSION / ASSESSMENT AND PLAN / ED COURSE  Pertinent labs & imaging results that were available during my care of the patient were reviewed by me and considered in my  medical decision making (see chart for details).        ____________________________________________   FINAL CLINICAL IMPRESSION(S) / ED DIAGNOSES  Final diagnoses:  TIA (transient ischemic attack)      NEW MEDICATIONS STARTED DURING THIS VISIT:  Discharge Medication List as of 10/26/2016  3:55 PM       Note:  This document was prepared using Dragon voice recognition software and may include unintentional dictation errors.    Arnaldo Natal, MD 10/27/16 [phone]

## 2016-10-26 NOTE — Progress Notes (Signed)
Pt ready for discharge home. Reviewed all discharge medications and f/u appts with patient. Pt home with Aunt.

## 2016-10-26 NOTE — Care Management (Signed)
Admitted to [facility] under observation status with the diagnosis of TIA. Lives with [facility]. Aunt Verl Blalock is Financial POA per Ms. Eliot Ford [phone]). Doesn't go to [facility] anymore. Last seen Dr. Marcello Fennel 2 months ago. Gets prescriptions filled at [facility] now, will be changing to [facility] on [address]. No falls. Decreased appetite, no weight loss. Takes care of all basic activities of daily living herself, doesn't drive. Aunt does errands. No equipment in the home. Aunt will transport home. Gwenette Greet RN MSN CCM Care Management [phone]

## 2016-10-26 NOTE — Progress Notes (Signed)
OT Cancellation Note  Patient Details Name: Dawn Stein MRN: [id number] DOB: 1971-06-02   Cancelled Treatment:    Reason Eval/Treat Not Completed: OT screened, no needs identified, will sign off  Olegario Messier, MS, OTR/L 10/26/2016, 4:59 PM

## 2016-10-26 NOTE — Care Management Obs Status (Signed)
MEDICARE OBSERVATION STATUS NOTIFICATION   Patient Details  Name: Dawn Stein MRN: [id number] Date of Birth: Jun 25, 1971   Medicare Observation Status Notification Given:  Yes    Gwenette Greet, RN 10/26/2016, 9:02 AM

## 2016-10-26 NOTE — Consult Note (Signed)
Referring Physician: Allena Katz    Chief Complaint: Slurred speech, drooling and weakness on the right side  HPI: Dawn Stein is an 46 y.o. female with a history of stroke and seizures who reports that on yesterday she had acute onset of drooling on the right side of the face and she also had some weakness in the right side of the body.  She reports then feeling weak all over and then was noted to begin to shake on both sides.  Was alert during the shaking.  This happened around 8:30 PM yesterday evening.  Initial NIHSS of 1.    Date last known well: Date: 10/25/2016 Time last known well: Time: 20:30 tPA Given: No: Improvement in symptoms  Past Medical History:  Diagnosis Date  . Seizures (HCC)   . Stroke [facility])     Past Surgical History:  Procedure Laterality Date  . none      Family History  Problem Relation Age of Onset  . Seizures Mother   . Lung cancer Maternal Grandfather    Social History:  reports that she has been smoking.  She has never used smokeless tobacco. She reports that she does not drink alcohol or use drugs.  Allergies:  Allergies  Allergen Reactions  . Tetracyclines & Related Anaphylaxis    Facial swelling    Medications:  I have reviewed the patient's current medications. Prior to Admission:  Prescriptions Prior to Admission  Medication Sig Dispense Refill Last Dose  . aspirin EC 81 MG tablet Take 81 mg by mouth daily.   04/08/2015 at am  . atorvastatin (LIPITOR) 10 MG tablet Take 10 mg by mouth daily.     . clopidogrel (PLAVIX) 75 MG tablet Take 75 mg by mouth daily.     . divalproex (DEPAKOTE ER) 250 MG 24 hr tablet Take 250 mg by mouth 4 (four) times daily.     Marland Kitchen levETIRAcetam (KEPPRA) 750 MG tablet Take 2 tablets (1,500 mg total) by mouth 2 (two) times daily. 120 tablet 1   . sertraline (ZOLOFT) 50 MG tablet Take 50 mg by mouth daily.    04/08/2015 at am   Scheduled: . aspirin  300 mg Rectal Daily   Or  . aspirin  325 mg Oral Daily  .  atorvastatin  40 mg Oral q1800  . clopidogrel  75 mg Oral Daily  . divalproex  500 mg Oral Q12H  . levETIRAcetam  1,500 mg Oral BID  . [START ON 10/27/2016] sertraline  50 mg Oral Daily    ROS: History obtained from the patient  General ROS: lethargy Psychological ROS: negative for - behavioral disorder, hallucinations, memory difficulties, mood swings or suicidal ideation Ophthalmic ROS: negative for - blurry vision, double vision, eye pain or loss of vision ENT ROS: negative for - epistaxis, nasal discharge, oral lesions, sore throat, tinnitus or vertigo Allergy and Immunology ROS: negative for - hives or itchy/watery eyes Hematological and Lymphatic ROS: negative for - bleeding problems, bruising or swollen lymph nodes Endocrine ROS: negative for - galactorrhea, hair pattern changes, polydipsia/polyuria or temperature intolerance Respiratory ROS: negative for - cough, hemoptysis, shortness of breath or wheezing Cardiovascular ROS: negative for - chest pain, dyspnea on exertion, edema or irregular heartbeat Gastrointestinal ROS: negative for - abdominal pain, diarrhea, hematemesis, nausea/vomiting or stool incontinence Genito-Urinary ROS: negative for - dysuria, hematuria, incontinence or urinary frequency/urgency Musculoskeletal ROS: left knee pain Neurological ROS: as noted in HPI Dermatological ROS: negative for rash and skin lesion changes  Physical  Examination: Blood pressure (!) 93/57, pulse (!) 57, temperature 97.9 F (36.6 C), temperature source Oral, resp. rate 18, height  (1.575 m), weight 96.7 kg (213 lb 1.6 oz), SpO2 100 %.  HEENT-  Normocephalic, no lesions, without obvious abnormality.  Normal external eye and conjunctiva.  Normal TM's bilaterally.  Normal auditory canals and external ears. Normal external nose, mucus membranes and septum.  Normal pharynx. Cardiovascular- S1, S2 normal, pulses palpable throughout   Lungs- chest clear, no wheezing, rales, normal  symmetric air entry Abdomen- soft, non-tender; bowel sounds normal; no masses,  no organomegaly Extremities- LE edema Lymph-no adenopathy palpable Musculoskeletal-no joint tenderness, deformity or swelling Skin-warm and dry, no hyperpigmentation, vitiligo, or suspicious lesions  Neurological Examination   Mental Status: Lethargic, oriented, thought content appropriate.  Speech fluent without evidence of aphasia with some mild dysarthria.  Able to follow 3 step commands without difficulty. Cranial Nerves: II: Discs flat bilaterally; RHH, pupils equal, round, reactive to light and accommodation III,IV, VI: ptosis not present, extra-ocular motions intact bilaterally V,VII: mild right facial droop, facial light touch sensation normal bilaterally VIII: hearing normal bilaterally IX,X: gag reflex present XI: bilateral shoulder shrug XII: midline tongue extension Motor: Right : Upper extremity   5-/5    Left:     Upper extremity   5/5  Lower extremity   5/5     Lower extremity   4+/5 Tone and bulk:normal tone throughout; no atrophy noted Sensory: Pinprick and light touch intact throughout, bilaterally Deep Tendon Reflexes: 3+ in the upper extremities and absent in the lower extremities Plantars: Right: downgoing   Left: downgoing Cerebellar: Normal finger-to-nose and normal heel-to-shin testing bilaterally Gait: not tested due to safety concerns    Laboratory Studies:  Basic Metabolic Panel:  Recent Labs Lab 10/25/16 2153  NA 138  K 4.5  CL 105  CO2 24  GLUCOSE 103*  BUN 23*  CREATININE 1.24*  CALCIUM 8.4*    Liver Function Tests: No results for input(s): AST, ALT, ALKPHOS, BILITOT, PROT, ALBUMIN in the last 168 hours. No results for input(s): LIPASE, AMYLASE in the last 168 hours. No results for input(s): AMMONIA in the last 168 hours.  CBC:  Recent Labs Lab 10/25/16 2153  WBC 4.2  HGB 12.7  HCT 38.6  MCV 81.4  PLT 92*    Cardiac Enzymes:  Recent Labs Lab  10/26/16 0026  TROPONINI <0.03    BNP: Invalid input(s): POCBNP  CBG:  Recent Labs Lab 10/25/16 2208  GLUCAP 86    Microbiology: Results for orders placed or performed in visit on 12/23/13  Culture, blood (single)     Status: None   Collection Time: 12/23/13  2:14 AM  Result Value Ref Range Status   Micro Text Report   Final       COMMENT                   NO GROWTH AEROBICALLY/ANAEROBICALLY IN 5 DAYS   ANTIBIOTIC                                                      Culture, blood (single)     Status: None   Collection Time: 12/23/13  2:15 AM  Result Value Ref Range Status   Micro Text Report   Final  COMMENT                   NO GROWTH AEROBICALLY/ANAEROBICALLY IN 5 DAYS   ANTIBIOTIC                                                        Coagulation Studies: No results for input(s): LABPROT, INR in the last 72 hours.  Urinalysis:  Recent Labs Lab 10/25/16 2153  COLORURINE YELLOW*  LABSPEC 1.027  PHURINE 6.0  GLUCOSEU NEGATIVE  HGBUR NEGATIVE  BILIRUBINUR NEGATIVE  KETONESUR 5*  PROTEINUR NEGATIVE  NITRITE NEGATIVE  LEUKOCYTESUR NEGATIVE    Lipid Panel:    Component Value Date/Time   CHOL 186 10/26/2016 0532   CHOL 150 04/01/2012 0413   TRIG 83 10/26/2016 0532   TRIG 72 04/01/2012 0413   HDL 32 (L) 10/26/2016 0532   HDL 38 (L) 04/01/2012 0413   CHOLHDL 5.8 10/26/2016 0532   VLDL 17 10/26/2016 0532   VLDL 14 04/01/2012 0413   LDLCALC 137 (H) 10/26/2016 0532   LDLCALC 98 04/01/2012 0413    HgbA1C:  Lab Results  Component Value Date   HGBA1C 5.2 03/31/2012    Urine Drug Screen:     Component Value Date/Time   LABOPIA NEGATIVE 02/01/2014 2340   COCAINSCRNUR NEGATIVE 02/01/2014 2340   LABBENZ NEGATIVE 02/01/2014 2340   AMPHETMU NEGATIVE 02/01/2014 2340   THCU POSITIVE 02/01/2014 2340   LABBARB NEGATIVE 02/01/2014 2340    Alcohol Level: No results for input(s): ETH in the last 168 hours.  Other results: EKG: normal sinus  rhythm at 75 bpm.  Imaging: Ct Head Wo Contrast  Result Date: 10/25/2016 CLINICAL DATA:  Seizures and multiple prior strokes EXAM: CT HEAD WITHOUT CONTRAST TECHNIQUE: Contiguous axial images were obtained from the base of the skull through the vertex without intravenous contrast. COMPARISON:  Brain MRI 04/08/2015 FINDINGS: Brain: There are multiple old infarcts of the right frontal lobe, left frontal lobe, right occipital lobe, left parietal lobe, left occipital lobe and right cerebellum. No intracranial hemorrhage. No midline shift or mass effect. Dilated ventricles are unchanged, secondary to atrophy. Vascular: No hyperdense vessel or unexpected calcification. Skull: Normal visualized skull base, calvarium and extracranial soft tissues. Sinuses/Orbits: No sinus fluid levels or advanced mucosal thickening. No mastoid effusion. Normal orbits. IMPRESSION: 1. Multiple old infarcts with associated encephalomalacia. No CT evidence of acute cortical infarct. 2. No intracranial hemorrhage. 3. Age advanced atrophy, unchanged. Electronically Signed   By: Deatra Robinson M.D.   On: 10/25/2016 23:52   Mr Brain Wo Contrast  Result Date: 10/26/2016 CLINICAL DATA:  TIA. History of seizure disorder. Weakness in the right body with drooling beginning last night. Symptoms are improved or resolved EXAM: MRI HEAD WITHOUT CONTRAST MRA HEAD WITHOUT CONTRAST TECHNIQUE: Multiplanar, multiecho pulse sequences of the brain and surrounding structures were obtained without intravenous contrast. Angiographic images of the head were obtained using MRA technique without contrast. COMPARISON:  Head CT from yesterday.  Brain MRI 04/08/2015 FINDINGS: MRI HEAD FINDINGS Brain: No acute infarction, hemorrhage, hydrocephalus, extra-axial collection or mass lesion. Multiple remote small and moderate-sized infarcts, present in the bilateral occipital, bilateral frontal, and left parietal cortex. Multiple small remote bilateral cerebellar  infarcts, more numerous on the right. Low cerebral volume for age, most notable at the upper convexity sulci. Given  the degree of ischemic injury for age, question if there is a underlying inflammatory or hereditary vasculopathy. Gliosis in the white matter as seen with chronic microvascular ischemia is minimal to none. No generalized chronic ischemic injury. Partially empty sella appearance, stable. Vascular: Arterial findings below. Normal dural venous sinus flow voids Skull and upper cervical spine: Negative Sinuses/Orbits: Negative MRA HEAD FINDINGS Fairly symmetric carotid and vertebral arteries. Incomplete circle-of-Willis at the left posterior communicating artery. There is atheromatous type irregularity of bilateral M2 branches and moderate narrowing at the left P1 segment. Poor signal in the left PCA territory, which may be due to this stenosis or downstream infarcts. No major branch occlusion. Small infundibulum at the right posterior communicating artery origin. Negative for aneurysm. No generalized vessel beading. IMPRESSION: 1. No acute finding. 2. Numerous remote PCA and MCA branch infarcts bilaterally. Multiple small remote bilateral cerebellar infarcts. 3. No acute finding on intracranial MRA. 4. Intracranial atherosclerosis with moderate left P1 segment stenosis. Electronically Signed   By: Marnee Spring M.D.   On: 10/26/2016 11:17   US Carotid Bilateral (at [facility] Only)  Result Date: 10/26/2016 CLINICAL DATA:  TIA. EXAM: BILATERAL CAROTID DUPLEX ULTRASOUND TECHNIQUE: Wallace Cullens scale imaging, color Doppler and duplex ultrasound were performed of bilateral carotid and vertebral arteries in the neck. COMPARISON:  07/06/2015 FINDINGS: Criteria: Quantification of carotid stenosis is based on velocity parameters that correlate the residual internal carotid diameter with NASCET-based stenosis levels, using the diameter of the distal internal carotid lumen as the denominator for stenosis measurement.  The following velocity measurements were obtained: RIGHT ICA:  86 cm/sec CCA:  73 cm/sec SYSTOLIC ICA/CCA RATIO:  1.2 DIASTOLIC ICA/CCA RATIO:  1.9 ECA:  60 cm/sec LEFT ICA:  87 cm/sec CCA:  97 cm/sec SYSTOLIC ICA/CCA RATIO:  0.9 DIASTOLIC ICA/CCA RATIO:  1.2 ECA:  77 cm/sec RIGHT CAROTID ARTERY: Minimal plaque at the right carotid bulb. External carotid artery is patent with normal waveform. Normal waveforms and velocities in the internal carotid artery. RIGHT VERTEBRAL ARTERY: Antegrade flow and normal waveform in the right vertebral artery. LEFT CAROTID ARTERY: Left carotid arteries are patent without significant plaque or stenosis. Normal waveforms and velocities in the internal carotid artery. External carotid artery is patent with normal waveform. LEFT VERTEBRAL ARTERY: Antegrade flow and normal waveform in the left vertebral artery. IMPRESSION: Minimal plaque at the right carotid bulb. Carotid arteries are widely patent without significant stenosis. Estimated degree of stenosis in the internal carotid arteries is less than 50% bilaterally. Patent vertebral arteries with antegrade flow. Electronically Signed   By: Richarda Overlie M.D.   On: 10/26/2016 12:54   Mr Maxine Glenn Head/brain WJ Cm  Result Date: 10/26/2016 CLINICAL DATA:  TIA. History of seizure disorder. Weakness in the right body with drooling beginning last night. Symptoms are improved or resolved EXAM: MRI HEAD WITHOUT CONTRAST MRA HEAD WITHOUT CONTRAST TECHNIQUE: Multiplanar, multiecho pulse sequences of the brain and surrounding structures were obtained without intravenous contrast. Angiographic images of the head were obtained using MRA technique without contrast. COMPARISON:  Head CT from yesterday.  Brain MRI 04/08/2015 FINDINGS: MRI HEAD FINDINGS Brain: No acute infarction, hemorrhage, hydrocephalus, extra-axial collection or mass lesion. Multiple remote small and moderate-sized infarcts, present in the bilateral occipital, bilateral frontal, and  left parietal cortex. Multiple small remote bilateral cerebellar infarcts, more numerous on the right. Low cerebral volume for age, most notable at the upper convexity sulci. Given the degree of ischemic injury for age, question if there is a  underlying inflammatory or hereditary vasculopathy. Gliosis in the white matter as seen with chronic microvascular ischemia is minimal to none. No generalized chronic ischemic injury. Partially empty sella appearance, stable. Vascular: Arterial findings below. Normal dural venous sinus flow voids Skull and upper cervical spine: Negative Sinuses/Orbits: Negative MRA HEAD FINDINGS Fairly symmetric carotid and vertebral arteries. Incomplete circle-of-Willis at the left posterior communicating artery. There is atheromatous type irregularity of bilateral M2 branches and moderate narrowing at the left P1 segment. Poor signal in the left PCA territory, which may be due to this stenosis or downstream infarcts. No major branch occlusion. Small infundibulum at the right posterior communicating artery origin. Negative for aneurysm. No generalized vessel beading. IMPRESSION: 1. No acute finding. 2. Numerous remote PCA and MCA branch infarcts bilaterally. Multiple small remote bilateral cerebellar infarcts. 3. No acute finding on intracranial MRA. 4. Intracranial atherosclerosis with moderate left P1 segment stenosis. Electronically Signed   By: Marnee Spring M.D.   On: 10/26/2016 11:17    Assessment: 46 y.o. female with a history of stroke and seizure presenting with an episode of right sided weakness and slurred speech accompanied by shaking.  Based on the description by family and patient it her presentation is not consistent with seizure.  Can not rule out TIA.  Patient currently reports that she is at baseline.  MRI reviewed and shows multiple old strokes but no acute events.  Patient on ASA and Plavix at home.  Carotid dopplers show no evidence of hemodynamically significant  stenosis.  Echocardiogram shows no cardiac source of emboli with an EF of 65%.  A1c pending, LDL 137. Patient also describes extreme drowsiness that occurs after her morning medications.  Is sleeping all day.    Stroke Risk Factors - smoking  Plan: 1. Smoking cessation discussed 2. Aggressive lipid management with target LDL<70. 3. PT consult, OT consult, Speech consult 4. Prophylactic therapy-Continue ASA and Plavix 5. Telemetry monitoring 6. Frequent neuro checks 7. Change Keppra to 750mg  qAM, 750mg  midday and 1500mg  qhs.  To keep follow up with Dr. Malvin Johns scheduled for Monday.       Thana Farr, MD Neurology [phone] 10/26/2016, 1:34 PM

## 2016-10-26 NOTE — ED Notes (Signed)
Pt transport to 124 

## 2016-10-26 NOTE — Evaluation (Signed)
Physical Therapy Evaluation Patient Details Name: Dawn Stein MRN: [id number] DOB: 12-03-1970 Today's Date: 10/26/2016   History of Present Illness  Pt is a 46 y.o. female who presented to the ED with R sided weakness and drooling and was admitted for TIA. Pt has PMH of stroke and seizure disorder.  Clinical Impression  Pt was lying in bed upon entry. Pt was agreeable to PT evaluation and requesting to use the restroom. Pt presents with bilateral generalized weakness and limited activity tolerance. Pt was modified independent with bed mobility and CGA for safety with transfers, ambulation, and stairs. Pt fatigues quickly with mobility, requiring multiple rest breaks during session. Pt will benefit from skilled PT during admission to increase activity tolerance and strength. No PT follow up recommended as pt presents near baseline level of function.    Follow Up Recommendations No PT follow up    Equipment Recommendations  None recommended by PT    Recommendations for Other Services       Precautions / Restrictions Precautions Precautions: Fall Restrictions Weight Bearing Restrictions: No      Mobility  Bed Mobility Overal bed mobility: Modified Independent             General bed mobility comments: HOB elevated getting out of bed and bed flat getting into bed  Transfers Overall transfer level: Independent               General transfer comment: Pt showed no difficulty or LOB with transfers; v/c's provided to not lean too far forward when sitting on toilet  Ambulation/Gait Ambulation/Gait assistance: Min guard (for safety)     Gait Pattern/deviations: Wide base of support;Drifts right/left;Decreased step length - right;Decreased step length - left   Gait velocity interpretation: Below normal speed for age/gender General Gait Details: Pt fatigues quickly, requiring 2 standing rest breaks; pt grabs onto furnature or the wall intermittently while  ambulating (suggested the use of a cane for when she feels like she is unsteady); pt lost her balance when turning to change direction x1 and required min assist to regain her balance, but no other LOB noted  Stairs Stairs: Yes Stairs assistance: Min guard Stair Management: One rail Right;Alternating pattern;Forwards Number of Stairs: 5 General stair comments: Pt fatigued and required rest breaks after going up then again after going down stairs.   Wheelchair Mobility    Modified Rankin (Stroke Patients Only)       Balance Overall balance assessment: Independent               Single Leg Stance - Right Leg: 5 Single Leg Stance - Left Leg: 5 Tandem Stance - Right Leg: 10 Tandem Stance - Left Leg: 10                     Pertinent Vitals/Pain Pain Assessment: No/denies pain    Home Living Family/patient expects to be discharged to:: Private residence Living Arrangements: Other relatives (Pt reports she lives with her aunt and grandmother) Available Help at Discharge: Family Type of Home: House Home Access: Stairs to enter Entrance Stairs-Rails: Right Entrance Stairs-Number of Steps: 3 Home Layout: One level Home Equipment: None      Prior Function Level of Independence: Independent               Hand Dominance        Extremity/Trunk Assessment   Upper Extremity Assessment Upper Extremity Assessment: Generalized weakness (Pt presents with 4/5 strength for B UE for  shoulder flexion, elbow flexion/extension, and grip strength )    Lower Extremity Assessment Lower Extremity Assessment: Generalized weakness (Pt presents grossly with at least 3/5 strength in B LE)    Cervical / Trunk Assessment Cervical / Trunk Assessment: Normal  Communication   Communication: No difficulties  Cognition Arousal/Alertness: Awake/alert (intermittently lethargic near end of eval) Behavior During Therapy: WFL for tasks assessed/performed Overall Cognitive Status:  Within Functional Limits for tasks assessed                                        General Comments  Pt requested to use the restroom x2 to void and have a BM. (Nursing notified) Pt mentioned that prior to admission she would experience intermitted throbbing in her L knee with ambulation. No redness, swelling, or change in skin temperature noted with palpation and pt stated she did not experience the throbbing during PT eval.     Exercises     Assessment/Plan    PT Assessment Patient needs continued PT services  PT Problem List Decreased activity tolerance;Decreased balance;Decreased strength       PT Treatment Interventions DME instruction;Therapeutic activities;Gait training;Therapeutic exercise;Patient/family education;Stair training;Balance training;Functional mobility training    PT Goals (Current goals can be found in the Care Plan section)  Acute Rehab PT Goals Patient Stated Goal: to not feel so tired while walking and to go home. PT Goal Formulation: With patient Time For Goal Achievement: 11/09/16 Potential to Achieve Goals: Good    Frequency Min 2X/week   Barriers to discharge        Co-evaluation               End of Session Equipment Utilized During Treatment: Gait belt Activity Tolerance: Patient tolerated treatment well;Patient limited by fatigue Patient left: in bed;with call bell/phone within reach;with bed alarm set Nurse Communication: Mobility status PT Visit Diagnosis: Unsteadiness on feet (R26.81);Muscle weakness (generalized) (M62.81)    Time: 4098-1191 PT Time Calculation (min) (ACUTE ONLY): 25 min   Charges:         PT G Codes:        Tiger Spieker, SPT 10/26/2016, 3:51 PM

## 2016-10-27 LAB — HEMOGLOBIN A1C
HEMOGLOBIN A1C: 5.4 % (ref 4.8–5.6)
MEAN PLASMA GLUCOSE: 108 mg/dL

## 2017-01-08 ENCOUNTER — Other Ambulatory Visit: Payer: Self-pay | Admitting: Neurology

## 2017-01-08 DIAGNOSIS — M79605 Pain in left leg: Secondary | ICD-10-CM

## 2017-01-09 ENCOUNTER — Emergency Department
Admission: EM | Admit: 2017-01-09 | Discharge: 2017-01-09 | Disposition: A | Discharge status: Home / Self Care | Payer: Medicare Other | Attending: Emergency Medicine | Admitting: Emergency Medicine

## 2017-01-09 ENCOUNTER — Encounter: Payer: Self-pay | Admitting: Emergency Medicine

## 2017-01-09 ENCOUNTER — Emergency Department: Payer: Medicare Other

## 2017-01-09 DIAGNOSIS — R4182 Altered mental status, unspecified: Secondary | ICD-10-CM | POA: Diagnosis present

## 2017-01-09 DIAGNOSIS — Z8673 Personal history of transient ischemic attack (TIA), and cerebral infarction without residual deficits: Secondary | ICD-10-CM | POA: Diagnosis not present

## 2017-01-09 DIAGNOSIS — F17200 Nicotine dependence, unspecified, uncomplicated: Secondary | ICD-10-CM | POA: Diagnosis not present

## 2017-01-09 DIAGNOSIS — Z7902 Long term (current) use of antithrombotics/antiplatelets: Secondary | ICD-10-CM | POA: Diagnosis not present

## 2017-01-09 DIAGNOSIS — Z7982 Long term (current) use of aspirin: Secondary | ICD-10-CM | POA: Diagnosis not present

## 2017-01-09 DIAGNOSIS — R531 Weakness: Secondary | ICD-10-CM | POA: Insufficient documentation

## 2017-01-09 DIAGNOSIS — Z79899 Other long term (current) drug therapy: Secondary | ICD-10-CM | POA: Diagnosis not present

## 2017-01-09 HISTORY — DX: Unspecified injury of head, initial encounter: S09.90XA

## 2017-01-09 LAB — CBC
HCT: 30.7 % — ABNORMAL LOW (ref 35.0–47.0)
Hemoglobin: 9.9 g/dL — ABNORMAL LOW (ref 12.0–16.0)
MCH: 26 pg (ref 26.0–34.0)
MCHC: 32.2 g/dL (ref 32.0–36.0)
MCV: 80.7 fL (ref 80.0–100.0)
PLATELETS: 84 10*3/uL — AB (ref 150–440)
RBC: 3.81 MIL/uL (ref 3.80–5.20)
RDW: 16 % — AB (ref 11.5–14.5)
WBC: 3.7 10*3/uL (ref 3.6–11.0)

## 2017-01-09 LAB — COMPREHENSIVE METABOLIC PANEL
ALBUMIN: 3.4 g/dL — AB (ref 3.5–5.0)
ALT: 18 U/L (ref 14–54)
AST: 38 U/L (ref 15–41)
Alkaline Phosphatase: 33 U/L — ABNORMAL LOW (ref 38–126)
Anion gap: 13 (ref 5–15)
BUN: 22 mg/dL — AB (ref 6–20)
CO2: 24 mmol/L (ref 22–32)
CREATININE: 0.83 mg/dL (ref 0.44–1.00)
Calcium: 8.6 mg/dL — ABNORMAL LOW (ref 8.9–10.3)
Chloride: 101 mmol/L (ref 101–111)
GFR calc Af Amer: >60 mL/min (ref >60)
GLUCOSE: 57 mg/dL — AB (ref 65–99)
POTASSIUM: 3.8 mmol/L (ref 3.5–5.1)
Sodium: 138 mmol/L (ref 135–145)
Total Bilirubin: 1 mg/dL (ref 0.3–1.2)
Total Protein: 6.3 g/dL — ABNORMAL LOW (ref 6.5–8.1)

## 2017-01-09 LAB — URINALYSIS, COMPLETE (UACMP) WITH MICROSCOPIC
BILIRUBIN URINE: NEGATIVE
Bacteria, UA: NONE SEEN
Glucose, UA: NEGATIVE mg/dL
Hgb urine dipstick: NEGATIVE
KETONES UR: 80 mg/dL — AB
LEUKOCYTES UA: NEGATIVE
Nitrite: NEGATIVE
PH: 5 (ref 5.0–8.0)
PROTEIN: NEGATIVE mg/dL
Specific Gravity, Urine: 1.026 (ref 1.005–1.030)

## 2017-01-09 LAB — TROPONIN I

## 2017-01-09 MED ORDER — SODIUM CHLORIDE 0.9 % IV BOLUS (SEPSIS)
1000.0000 mL | Freq: Once | INTRAVENOUS | Status: AC
  Administered 2017-01-09: 1000 mL via INTRAVENOUS

## 2017-01-09 NOTE — ED Notes (Signed)
Patient denies pain and is resting comfortably.  

## 2017-01-09 NOTE — ED Notes (Signed)
To MRI via stretcher.  AAOx3.

## 2017-01-09 NOTE — ED Notes (Signed)
AAOx3.  Skin warm and dry.  Ambulates with easy and steady gait. NAD 

## 2017-01-09 NOTE — ED Triage Notes (Signed)
Arrives from home via [facility].  Family called because patient has been declining since Sunday.  Family state pateint has been weak and mental status altered.  Decreased PO intake x 1 day.

## 2017-01-09 NOTE — ED Provider Notes (Signed)
[facility] Emergency Department Provider Note  Time seen: 7:16 AM  I have reviewed the triage vital signs and the nursing notes.   HISTORY  Chief Complaint Altered Mental Status    HPI Alin Tiaria Biby is a 46 y.o. female with a past medical history of TBI, seizure disorder, presents the emergency department for generalized weakness. According to EMS report patient lives with family who has reported over the past 3 days the patient has been progressively more weak with decreased responsiveness. Here the patient is awake alert, denies any complaints. Patient is somewhat slow/sluggish with her responses but does answer all questions and follows all commands. Her responses appear to be accurate and she is able to provide a good history. Patient is a largely negative review of systems. She states she is only here because her family was worried.  Past Medical History:  Diagnosis Date  . Closed head injury    at age 14  . Seizures (HCC)   . Stroke [facility])     Patient Active Problem List   Diagnosis Date Noted  . TIA (transient ischemic attack) 10/26/2016  . CVA (cerebral vascular accident) (HCC) 10/26/2016  . Slurred speech 04/08/2015    Past Surgical History:  Procedure Laterality Date  . none      Prior to Admission medications   Medication Sig Start Date End Date Taking? Authorizing Provider  aspirin EC 81 MG tablet Take 81 mg by mouth daily.    [provider]  atorvastatin (LIPITOR) 10 MG tablet Take 10 mg by mouth daily. 09/03/16 09/03/17  [provider]  clopidogrel (PLAVIX) 75 MG tablet Take 75 mg by mouth daily. 09/03/16 09/03/17  [provider]  divalproex (DEPAKOTE ER) 250 MG 24 hr tablet Take 250 mg by mouth 4 (four) times daily.    [provider]  levETIRAcetam (KEPPRA) 750 MG tablet Take 1 tablet (750 mg total) by mouth 2 (two) times daily at 8 am and 1 pm. 10/26/16   Enedina Finner, MD  levETIRAcetam (KEPPRA)  750 MG tablet Take 2 tablets (1,500 mg total) by mouth at bedtime. 10/26/16   Enedina Finner, MD  sertraline (ZOLOFT) 50 MG tablet Take 50 mg by mouth daily.     [provider]    Allergies  Allergen Reactions  . Tetracyclines & Related Anaphylaxis    Facial swelling    Family History  Problem Relation Age of Onset  . Seizures Mother   . Lung cancer Maternal Grandfather     Social History Social History  Substance Use Topics  . Smoking status: Current Every Day Smoker  . Smokeless tobacco: Never Used  . Alcohol use No    Review of Systems Constitutional: Negative for fever. Eyes: Negative for visual changes. ENT: Negative for congestion Cardiovascular: Negative for chest pain. Respiratory: Negative for shortness of breath. Gastrointestinal: Negative for abdominal pain, vomiting and diarrhea. Genitourinary: Negative for dysuria. Musculoskeletal: Chronic right arm and left leg numbness with pains. Patient states this is unchanged. Skin: Negative for rash. Neurological: Negative for headache All other ROS negative  ____________________________________________   PHYSICAL EXAM:  Constitutional: Alert. Well appearing and in no distress. Eyes: Normal exam ENT   Head: Normocephalic and atraumatic   Mouth/Throat: Mucous membranes are moist. Cardiovascular: Normal rate, regular rhythm. No murmur Respiratory: Normal respiratory effort without tachypnea nor retractions. Breath sounds are clear  Gastrointestinal: Soft and nontender. No distention.   Musculoskeletal: Nontender with normal range of motion in all extremities.  Neurologic:  Normal speech and language. No gross focal neurologic deficits Skin:  Skin is warm, dry and intact.  Psychiatric: Mood and affect are normal.   ____________________________________________   RADIOLOGY  MRI negative for acute infarct  ____________________________________________   INITIAL IMPRESSION / ASSESSMENT AND PLAN /  ED COURSE  Pertinent labs & imaging results that were available during my care of the patient were reviewed by me and considered in my medical decision making (see chart for details).  The patient presents to the emergency department for 3 days of worsening weakness according to family. Here the patient appears well, she does appear somewhat sluggish in her responses but answers all questions and follows all commands appropriately. In reviewing the patient's records she appears to have multiple infarcts in the past. Given the patient's worsening weakness we will check labs, urinalysis as well as an MRI of the brain.  Patient's labs are normal/baseline. Urinalysis is negative. MRI of the brain shows old infarcts but nothing acute. Overall with a normal workup in no acute findings believe the patient is safe for discharge home with PCP follow-up.  ____________________________________________   FINAL CLINICAL IMPRESSION(S) / ED DIAGNOSES  Weakness    Minna AntisPaduchowski, Ruthellen Tippy, MD 01/09/17 [phone]

## 2017-01-14 ENCOUNTER — Ambulatory Visit: Payer: Medicare Other

## 2017-01-21 ENCOUNTER — Ambulatory Visit: Payer: Medicare Other

## 2017-03-08 ENCOUNTER — Emergency Department
Admission: EM | Admit: 2017-03-08 | Discharge: 2017-03-08 | Disposition: A | Discharge status: Home / Self Care | Payer: Medicare Other | Attending: Emergency Medicine | Admitting: Emergency Medicine

## 2017-03-08 ENCOUNTER — Emergency Department: Payer: Medicare Other

## 2017-03-08 DIAGNOSIS — Z043 Encounter for examination and observation following other accident: Secondary | ICD-10-CM | POA: Insufficient documentation

## 2017-03-08 DIAGNOSIS — W19XXXA Unspecified fall, initial encounter: Secondary | ICD-10-CM | POA: Diagnosis not present

## 2017-03-08 DIAGNOSIS — R531 Weakness: Secondary | ICD-10-CM | POA: Diagnosis not present

## 2017-03-08 DIAGNOSIS — Z8673 Personal history of transient ischemic attack (TIA), and cerebral infarction without residual deficits: Secondary | ICD-10-CM | POA: Diagnosis not present

## 2017-03-08 DIAGNOSIS — F17200 Nicotine dependence, unspecified, uncomplicated: Secondary | ICD-10-CM | POA: Diagnosis not present

## 2017-03-08 DIAGNOSIS — Z7982 Long term (current) use of aspirin: Secondary | ICD-10-CM | POA: Diagnosis not present

## 2017-03-08 DIAGNOSIS — Y998 Other external cause status: Secondary | ICD-10-CM | POA: Diagnosis not present

## 2017-03-08 DIAGNOSIS — Z79899 Other long term (current) drug therapy: Secondary | ICD-10-CM | POA: Insufficient documentation

## 2017-03-08 DIAGNOSIS — Y92009 Unspecified place in unspecified non-institutional (private) residence as the place of occurrence of the external cause: Secondary | ICD-10-CM | POA: Insufficient documentation

## 2017-03-08 DIAGNOSIS — Z7902 Long term (current) use of antithrombotics/antiplatelets: Secondary | ICD-10-CM | POA: Diagnosis not present

## 2017-03-08 DIAGNOSIS — E860 Dehydration: Secondary | ICD-10-CM

## 2017-03-08 DIAGNOSIS — Y9301 Activity, walking, marching and hiking: Secondary | ICD-10-CM | POA: Insufficient documentation

## 2017-03-08 LAB — URINALYSIS, COMPLETE (UACMP) WITH MICROSCOPIC
BACTERIA UA: NONE SEEN
Bilirubin Urine: NEGATIVE
Glucose, UA: NEGATIVE mg/dL
Hgb urine dipstick: NEGATIVE
Ketones, ur: 20 mg/dL — AB
Leukocytes, UA: NEGATIVE
Nitrite: NEGATIVE
PH: 5 (ref 5.0–8.0)
Protein, ur: 30 mg/dL — AB
RBC / HPF: NONE SEEN RBC/hpf (ref 0–5)
SPECIFIC GRAVITY, URINE: 1.025 (ref 1.005–1.030)

## 2017-03-08 LAB — COMPREHENSIVE METABOLIC PANEL
ALT: 10 U/L — AB (ref 14–54)
AST: 19 U/L (ref 15–41)
Albumin: 3 g/dL — ABNORMAL LOW (ref 3.5–5.0)
Alkaline Phosphatase: 36 U/L — ABNORMAL LOW (ref 38–126)
Anion gap: 8 (ref 5–15)
BILIRUBIN TOTAL: 0.5 mg/dL (ref 0.3–1.2)
BUN: 21 mg/dL — ABNORMAL HIGH (ref 6–20)
CO2: 25 mmol/L (ref 22–32)
CREATININE: 1.18 mg/dL — AB (ref 0.44–1.00)
Calcium: 8.2 mg/dL — ABNORMAL LOW (ref 8.9–10.3)
Chloride: 105 mmol/L (ref 101–111)
GFR calc non Af Amer: 55 mL/min — ABNORMAL LOW (ref >60)
Glucose, Bld: 95 mg/dL (ref 65–99)
Potassium: 3.6 mmol/L (ref 3.5–5.1)
Sodium: 138 mmol/L (ref 135–145)
TOTAL PROTEIN: 6.4 g/dL — AB (ref 6.5–8.1)

## 2017-03-08 LAB — CBC WITH DIFFERENTIAL/PLATELET
BASOS ABS: 0 10*3/uL (ref 0–0.1)
Basophils Relative: 0 %
EOS ABS: 0.2 10*3/uL (ref 0–0.7)
Eosinophils Relative: 5 %
HEMATOCRIT: 34.2 % — AB (ref 35.0–47.0)
Hemoglobin: 11 g/dL — ABNORMAL LOW (ref 12.0–16.0)
LYMPHS ABS: 0.4 10*3/uL — AB (ref 1.0–3.6)
Lymphocytes Relative: 8 %
MCH: 26 pg (ref 26.0–34.0)
MCHC: 32.2 g/dL (ref 32.0–36.0)
MCV: 80.9 fL (ref 80.0–100.0)
MONO ABS: 0.4 10*3/uL (ref 0.2–0.9)
Monocytes Relative: 9 %
Neutro Abs: 3.8 10*3/uL (ref 1.4–6.5)
Neutrophils Relative %: 78 %
PLATELETS: 83 10*3/uL — AB (ref 150–440)
RBC: 4.23 MIL/uL (ref 3.80–5.20)
RDW: 18.4 % — AB (ref 11.5–14.5)
WBC: 4.8 10*3/uL (ref 3.6–11.0)

## 2017-03-08 LAB — LACTIC ACID, PLASMA: Lactic Acid, Venous: 1.5 mmol/L (ref 0.5–1.9)

## 2017-03-08 MED ORDER — SODIUM CHLORIDE 0.9 % IV SOLN
1000.0000 mL | Freq: Once | INTRAVENOUS | Status: AC
  Administered 2017-03-08: 1000 mL via INTRAVENOUS

## 2017-03-08 NOTE — ED Notes (Signed)
Continuing to encourage pt to drink water

## 2017-03-08 NOTE — ED Provider Notes (Signed)
[facility] Emergency Department Provider Note   ____________________________________________    I have reviewed the triage vital signs and the nursing notes.   HISTORY  Chief Complaint Fall and Weakness     HPI Dawn Stein is a 46 y.o. female with a history of seizures and stroke which left her with left leg weakness as well as right hand weakness presents after a fall. Patient reports she fell on her back porch because she lost her balance and was unable to get up. She says because of the chronic weakness in her left leg she has difficulty getting up she called her grandmother who called EMS. They helped her to her feet. She reports she initially had pain in her hip but feels better now. No head injury. EMS notes initial fever, afebrile in the department. Denies cough or dysuria abdominal pain or rash. No new focal weakness. No headache   Past Medical History:  Diagnosis Date  . Closed head injury    at age 46  . Seizures (HCC)   . Stroke [facility](HCC)     Patient Active Problem List   Diagnosis Date Noted  . TIA (transient ischemic attack) 10/26/2016  . CVA (cerebral vascular accident) (HCC) 10/26/2016  . Slurred speech 04/08/2015    Past Surgical History:  Procedure Laterality Date  . none      Prior to Admission medications   Medication Sig Start Date End Date Taking? Authorizing Provider  aspirin EC 81 MG tablet Take 81 mg by mouth daily.    [provider]  atorvastatin (LIPITOR) 10 MG tablet Take 10 mg by mouth daily. 09/03/16 09/03/17  [provider]  clopidogrel (PLAVIX) 75 MG tablet Take 75 mg by mouth daily. 09/03/16 09/03/17  [provider]  divalproex (DEPAKOTE ER) 250 MG 24 hr tablet Take 250 mg by mouth daily.     [provider]  levETIRAcetam (KEPPRA) 750 MG tablet Take 1 tablet (750 mg total) by mouth 2 (two) times daily at 8 am and 1 pm. Patient taking differently: Take 750 mg by mouth  2 (two) times daily.  10/26/16   Enedina FinnerPatel, Sona, MD  levETIRAcetam (KEPPRA) 750 MG tablet Take 2 tablets (1,500 mg total) by mouth at bedtime. Patient not taking: Reported on 01/09/2017 10/26/16   Enedina FinnerPatel, Sona, MD  sertraline (ZOLOFT) 50 MG tablet Take 50 mg by mouth daily.     [provider]     Allergies Tetracyclines & related  Family History  Problem Relation Age of Onset  . Seizures Mother   . Lung cancer Maternal Grandfather     Social History Social History  Substance Use Topics  . Smoking status: Current Every Day Smoker  . Smokeless tobacco: Never Used  . Alcohol use No    Review of Systems  Constitutional: does complain of decreased appetite over the last several days Eyes: No visual changes.  ENT: No sore throat. Cardiovascular: Denies chest pain. Respiratory: Denies shortness of breath. Gastrointestinal: No abdominal pain.  No nausea, no vomiting.   Genitourinary: Negative for dysuria. Musculoskeletal: Negative for back pain. Skin: Negative for rash. Neurological: Negative for headaches    ____________________________________________   PHYSICAL EXAM:  VITAL SIGNS: ED Triage Vitals [03/08/17 0913]  Enc Vitals Group     BP 107/65     Pulse Rate 91     Resp 16     Temp 99.2 F (37.3 C)     Temp Source Oral  SpO2 99 %     Weight      Height      Head Circumference      Peak Flow      Pain Score      Pain Loc      Pain Edu?      Excl. in GC?     Constitutional: Alert and oriented. No acute distress. Pleasant and interactive Eyes: Conjunctivae are normal.  Head: Atraumatic. Nose: No congestion/rhinnorhea. Mouth/Throat: Mucous membranes are moist.   Neck:  Painless ROM Cardiovascular: Normal rate, regular rhythm. Grossly normal heart sounds.  Good peripheral circulation. Respiratory: Normal respiratory effort.  No retractions. Lungs CTAB. Gastrointestinal: Soft and nontender. No distention.  No CVA tenderness. Genitourinary:  deferred Musculoskeletal: No lower extremity tenderness nor edema.  Warm and well perfused Neurologic:  Normal speech and language. No gross focal neurologic deficits are appreciated.  Skin:  Skin is warm, dry and intact. No rash noted. Psychiatric: Mood and affect are normal. Speech and behavior are normal.  ____________________________________________   LABS (all labs ordered are listed, but only abnormal results are displayed)  Labs Reviewed  COMPREHENSIVE METABOLIC PANEL - Abnormal; Notable for the following:       Result Value   BUN 21 (*)    Creatinine, Ser 1.18 (*)    Calcium 8.2 (*)    Total Protein 6.4 (*)    Albumin 3.0 (*)    ALT 10 (*)    Alkaline Phosphatase 36 (*)    GFR calc non Af Amer 55 (*)    All other components within normal limits  CBC WITH DIFFERENTIAL/PLATELET - Abnormal; Notable for the following:    Hemoglobin 11.0 (*)    HCT 34.2 (*)    RDW 18.4 (*)    Platelets 83 (*)    Lymphs Abs 0.4 (*)    All other components within normal limits  URINALYSIS, COMPLETE (UACMP) WITH MICROSCOPIC - Abnormal; Notable for the following:    Color, Urine YELLOW (*)    APPearance HAZY (*)    Ketones, ur 20 (*)    Protein, ur 30 (*)    Squamous Epithelial / LPF 6-30 (*)    All other components within normal limits  LACTIC ACID, PLASMA  LACTIC ACID, PLASMA   ____________________________________________  EKG  None ____________________________________________  RADIOLOGY  None ____________________________________________   PROCEDURES  Procedure(s) performed: No    Critical Care performed: No ____________________________________________   INITIAL IMPRESSION / ASSESSMENT AND PLAN / ED COURSE  Pertinent labs & imaging results that were available during my care of the patient were reviewed by me and considered in my medical decision making (see chart for details).  She overall well-appearing and in no acute distress. Vital signs are unremarkable here.  Given reports of fever we will check labs and urine and chest x-ray and reevaluate.  Patient received IV fluids and feels definitely better. Vital signs are unremarkable. Lab work is reassuring, no evidence of infection although some dehydration. Have recommended discharge with focus on fluid intake    ____________________________________________   FINAL CLINICAL IMPRESSION(S) / ED DIAGNOSES  Final diagnoses:  Fall, initial encounter  Dehydration      NEW MEDICATIONS STARTED DURING THIS VISIT:  New Prescriptions   No medications on file     Note:  This document was prepared using Dragon voice recognition software and may include unintentional dictation errors.    Jene Every, MD 03/08/17 1304

## 2017-03-08 NOTE — ED Triage Notes (Signed)
Pt came to ED via EMS from home c/o multiple falls (last night and this morning) History of stroke and seizures. Per EMS, temperature 101.2, here in ED 99.2. All other vs stable.

## 2017-03-08 NOTE — ED Notes (Signed)
PT reports cannot urinate right now. Given water, will try again to obtain urine.

## 2017-03-29 ENCOUNTER — Other Ambulatory Visit: Payer: Self-pay | Admitting: Internal Medicine

## 2017-03-29 DIAGNOSIS — N6489 Other specified disorders of breast: Secondary | ICD-10-CM

## 2017-04-18 ENCOUNTER — Other Ambulatory Visit: Payer: Medicare Other

## 2017-05-06 ENCOUNTER — Other Ambulatory Visit: Payer: Medicare Other

## 2017-06-03 ENCOUNTER — Ambulatory Visit
Admission: RE | Admit: 2017-06-03 | Discharge: 2017-06-03 | Disposition: A | Discharge status: Home / Self Care | Payer: Medicare Other | Source: Ambulatory Visit | Attending: Internal Medicine | Admitting: Internal Medicine

## 2017-06-03 DIAGNOSIS — N6489 Other specified disorders of breast: Secondary | ICD-10-CM | POA: Diagnosis not present

## 2018-01-22 DIAGNOSIS — R569 Unspecified convulsions: Secondary | ICD-10-CM | POA: Diagnosis not present

## 2018-01-22 DIAGNOSIS — I639 Cerebral infarction, unspecified: Secondary | ICD-10-CM | POA: Diagnosis not present

## 2018-01-22 DIAGNOSIS — M25532 Pain in left wrist: Secondary | ICD-10-CM | POA: Diagnosis not present

## 2018-01-22 DIAGNOSIS — M25531 Pain in right wrist: Secondary | ICD-10-CM | POA: Diagnosis not present

## 2018-01-22 DIAGNOSIS — M79605 Pain in left leg: Secondary | ICD-10-CM | POA: Diagnosis not present

## 2018-02-19 DIAGNOSIS — E7800 Pure hypercholesterolemia, unspecified: Secondary | ICD-10-CM | POA: Diagnosis not present

## 2018-02-19 DIAGNOSIS — R635 Abnormal weight gain: Secondary | ICD-10-CM | POA: Diagnosis not present

## 2018-02-19 DIAGNOSIS — G40909 Epilepsy, unspecified, not intractable, without status epilepticus: Secondary | ICD-10-CM | POA: Diagnosis not present

## 2018-02-19 DIAGNOSIS — E870 Hyperosmolality and hypernatremia: Secondary | ICD-10-CM | POA: Diagnosis not present

## 2018-02-19 DIAGNOSIS — Z6841 Body Mass Index (BMI) 40.0 and over, adult: Secondary | ICD-10-CM | POA: Diagnosis not present

## 2018-02-20 DIAGNOSIS — R569 Unspecified convulsions: Secondary | ICD-10-CM | POA: Diagnosis not present

## 2018-02-26 DIAGNOSIS — G40909 Epilepsy, unspecified, not intractable, without status epilepticus: Secondary | ICD-10-CM | POA: Diagnosis not present

## 2018-02-26 DIAGNOSIS — M25521 Pain in right elbow: Secondary | ICD-10-CM | POA: Diagnosis not present

## 2018-02-26 DIAGNOSIS — M7989 Other specified soft tissue disorders: Secondary | ICD-10-CM | POA: Diagnosis not present

## 2018-02-26 DIAGNOSIS — Z8673 Personal history of transient ischemic attack (TIA), and cerebral infarction without residual deficits: Secondary | ICD-10-CM | POA: Diagnosis not present

## 2018-02-26 DIAGNOSIS — Z6841 Body Mass Index (BMI) 40.0 and over, adult: Secondary | ICD-10-CM | POA: Diagnosis not present

## 2018-02-26 DIAGNOSIS — E7800 Pure hypercholesterolemia, unspecified: Secondary | ICD-10-CM | POA: Diagnosis not present

## 2018-02-26 DIAGNOSIS — N289 Disorder of kidney and ureter, unspecified: Secondary | ICD-10-CM | POA: Diagnosis not present

## 2018-02-26 DIAGNOSIS — S5001XA Contusion of right elbow, initial encounter: Secondary | ICD-10-CM | POA: Diagnosis not present

## 2018-02-26 DIAGNOSIS — Z720 Tobacco use: Secondary | ICD-10-CM | POA: Diagnosis not present

## 2018-02-26 DIAGNOSIS — W010XXA Fall on same level from slipping, tripping and stumbling without subsequent striking against object, initial encounter: Secondary | ICD-10-CM | POA: Diagnosis not present

## 2018-04-08 DIAGNOSIS — M25531 Pain in right wrist: Secondary | ICD-10-CM | POA: Diagnosis not present

## 2018-04-08 DIAGNOSIS — Z23 Encounter for immunization: Secondary | ICD-10-CM | POA: Diagnosis not present

## 2018-04-08 DIAGNOSIS — R55 Syncope and collapse: Secondary | ICD-10-CM | POA: Diagnosis not present

## 2018-04-08 DIAGNOSIS — I639 Cerebral infarction, unspecified: Secondary | ICD-10-CM | POA: Diagnosis not present

## 2018-04-08 DIAGNOSIS — M25532 Pain in left wrist: Secondary | ICD-10-CM | POA: Diagnosis not present

## 2018-04-08 DIAGNOSIS — R200 Anesthesia of skin: Secondary | ICD-10-CM | POA: Diagnosis not present

## 2018-04-08 DIAGNOSIS — M79605 Pain in left leg: Secondary | ICD-10-CM | POA: Diagnosis not present

## 2018-04-08 DIAGNOSIS — R569 Unspecified convulsions: Secondary | ICD-10-CM | POA: Diagnosis not present

## 2018-04-24 DIAGNOSIS — R569 Unspecified convulsions: Secondary | ICD-10-CM | POA: Diagnosis not present

## 2018-05-13 ENCOUNTER — Emergency Department: Payer: Medicare HMO

## 2018-05-13 ENCOUNTER — Other Ambulatory Visit: Payer: Self-pay

## 2018-05-13 ENCOUNTER — Emergency Department
Admission: EM | Admit: 2018-05-13 | Discharge: 2018-05-13 | Disposition: A | Discharge status: Home / Self Care | Payer: Medicare HMO | Attending: Emergency Medicine | Admitting: Emergency Medicine

## 2018-05-13 DIAGNOSIS — K5730 Diverticulosis of large intestine without perforation or abscess without bleeding: Secondary | ICD-10-CM | POA: Diagnosis not present

## 2018-05-13 DIAGNOSIS — Z79899 Other long term (current) drug therapy: Secondary | ICD-10-CM | POA: Insufficient documentation

## 2018-05-13 DIAGNOSIS — R1031 Right lower quadrant pain: Secondary | ICD-10-CM | POA: Insufficient documentation

## 2018-05-13 DIAGNOSIS — Z7982 Long term (current) use of aspirin: Secondary | ICD-10-CM | POA: Diagnosis not present

## 2018-05-13 DIAGNOSIS — F17200 Nicotine dependence, unspecified, uncomplicated: Secondary | ICD-10-CM | POA: Diagnosis not present

## 2018-05-13 DIAGNOSIS — K8020 Calculus of gallbladder without cholecystitis without obstruction: Secondary | ICD-10-CM | POA: Diagnosis not present

## 2018-05-13 LAB — COMPREHENSIVE METABOLIC PANEL
ALBUMIN: 3.5 g/dL (ref 3.5–5.0)
ALK PHOS: 72 U/L (ref 38–126)
ALT: 11 U/L (ref 0–44)
AST: 16 U/L (ref 15–41)
Anion gap: 5 (ref 5–15)
BILIRUBIN TOTAL: 0.8 mg/dL (ref 0.3–1.2)
BUN: 15 mg/dL (ref 6–20)
CALCIUM: 8.4 mg/dL — AB (ref 8.9–10.3)
CO2: 25 mmol/L (ref 22–32)
CREATININE: 1.2 mg/dL — AB (ref 0.44–1.00)
Chloride: 114 mmol/L — ABNORMAL HIGH (ref 98–111)
GFR calc Af Amer: >60 mL/min (ref >60)
GFR calc non Af Amer: 53 mL/min — ABNORMAL LOW (ref >60)
GLUCOSE: 83 mg/dL (ref 70–99)
Potassium: 4.2 mmol/L (ref 3.5–5.1)
SODIUM: 144 mmol/L (ref 135–145)
TOTAL PROTEIN: 6.3 g/dL — AB (ref 6.5–8.1)

## 2018-05-13 LAB — URINALYSIS, COMPLETE (UACMP) WITH MICROSCOPIC
Bilirubin Urine: NEGATIVE
Glucose, UA: NEGATIVE mg/dL
Hgb urine dipstick: NEGATIVE
Ketones, ur: NEGATIVE mg/dL
Leukocytes, UA: NEGATIVE
Nitrite: NEGATIVE
PROTEIN: NEGATIVE mg/dL
Specific Gravity, Urine: 1.024 (ref 1.005–1.030)
pH: 6 (ref 5.0–8.0)

## 2018-05-13 LAB — CBC
HCT: 44.7 % (ref 36.0–46.0)
Hemoglobin: 13.7 g/dL (ref 12.0–15.0)
MCH: 26.7 pg (ref 26.0–34.0)
MCHC: 30.6 g/dL (ref 30.0–36.0)
MCV: 87.1 fL (ref 80.0–100.0)
PLATELETS: 161 10*3/uL (ref 150–400)
RBC: 5.13 MIL/uL — ABNORMAL HIGH (ref 3.87–5.11)
RDW: 14.9 % (ref 11.5–15.5)
WBC: 3.7 10*3/uL — ABNORMAL LOW (ref 4.0–10.5)
nRBC: 0 % (ref 0.0–0.2)

## 2018-05-13 LAB — POCT PREGNANCY, URINE: Preg Test, Ur: NEGATIVE

## 2018-05-13 LAB — LIPASE, BLOOD: Lipase: 27 U/L (ref 11–51)

## 2018-05-13 MED ORDER — KETOROLAC TROMETHAMINE 30 MG/ML IJ SOLN: 15.0000 mg | Freq: Once | INTRAMUSCULAR | Status: DC

## 2018-05-13 MED ORDER — IOPAMIDOL (ISOVUE-300) INJECTION 61%
100.0000 mL | Freq: Once | INTRAVENOUS | Status: AC | PRN
  Administered 2018-05-13: 100 mL via INTRAVENOUS

## 2018-05-13 NOTE — ED Provider Notes (Signed)
[facility] Emergency Department Provider Note  ____________________________________________  Time seen: Approximately 7:05 PM  I have reviewed the triage vital signs and the nursing notes.   HISTORY  Chief Complaint Pelvic Pain   HPI Dawn Stein is a 47 y.o. female with history of seizures and a prior stroke who presents for evaluation of right lower quadrant abdominal pain.  Patient reports that she woke up this afternoon at 4 PM because of the pain.  The pain is sharp, located in the right lower quadrant, constant, radiating to her back, and is moderate in intensity.  No nausea, vomiting, fever, chills, vaginal discharge or bleeding, constipation or diarrhea, dysuria or hematuria.  No prior abdominal surgeries. Patient denies any prior similar pain. Patient denies any prior h/o STD.    Past Medical History:  Diagnosis Date  . Closed head injury    at age 35  . Seizures (HCC)   . Stroke [facility])     Patient Active Problem List   Diagnosis Date Noted  . TIA (transient ischemic attack) 10/26/2016  . CVA (cerebral vascular accident) (HCC) 10/26/2016  . Slurred speech 04/08/2015    Past Surgical History:  Procedure Laterality Date  . none      Prior to Admission medications   Medication Sig Start Date End Date Taking? Authorizing Provider  aspirin EC 81 MG tablet Take 81 mg by mouth daily.    [provider]  atorvastatin (LIPITOR) 10 MG tablet Take 10 mg by mouth daily. 09/03/16 09/03/17  [provider]  divalproex (DEPAKOTE ER) 250 MG 24 hr tablet Take 250 mg by mouth daily.     [provider]  levETIRAcetam (KEPPRA) 750 MG tablet Take 1 tablet (750 mg total) by mouth 2 (two) times daily at 8 am and 1 pm. Patient taking differently: Take 750 mg by mouth 2 (two) times daily.  10/26/16   Enedina Finner, MD  levETIRAcetam (KEPPRA) 750 MG tablet Take 2 tablets (1,500 mg total) by mouth at bedtime. Patient not taking:  Reported on 01/09/2017 10/26/16   Enedina Finner, MD  sertraline (ZOLOFT) 50 MG tablet Take 50 mg by mouth daily.     [provider]    Allergies Tetracyclines & related  Family History  Problem Relation Age of Onset  . Seizures Mother   . Lung cancer Maternal Grandfather   . Breast cancer Maternal Aunt     Social History Social History   Tobacco Use  . Smoking status: Current Every Day Smoker  . Smokeless tobacco: Never Used  Substance Use Topics  . Alcohol use: No  . Drug use: No    Review of Systems  Constitutional: Negative for fever. Eyes: Negative for visual changes. ENT: Negative for sore throat. Neck: No neck pain  Cardiovascular: Negative for chest pain. Respiratory: Negative for shortness of breath. Gastrointestinal: + RLQ abdominal pain. No vomiting or diarrhea. Genitourinary: Negative for dysuria. Musculoskeletal: Negative for back pain. Skin: Negative for rash. Neurological: Negative for headaches, weakness or numbness. Psych: No SI or HI  ____________________________________________   PHYSICAL EXAM:  VITAL SIGNS: ED Triage Vitals [05/13/18 1822]  Enc Vitals Group     BP 109/60     Pulse Rate 64     Resp 18     Temp 98.5 F (36.9 C)     Temp Source Oral     SpO2 100 %     Weight 244 lb (110.7 kg)     Height 5'  2" (1.575 m)     Head Circumference      Peak Flow      Pain Score 10     Pain Loc      Pain Edu?      Excl. in GC?     Constitutional: Alert and oriented. Well appearing and in no apparent distress. HEENT:      Head: Normocephalic and atraumatic.         Eyes: Conjunctivae are normal. Sclera is non-icteric.       Mouth/Throat: Mucous membranes are moist.       Neck: Supple with no signs of meningismus. Cardiovascular: Regular rate and rhythm. No murmurs, gallops, or rubs. 2+ symmetrical distal pulses are present in all extremities. No JVD. Respiratory: Normal respiratory effort. Lungs are clear to auscultation  bilaterally. No wheezes, crackles, or rhonchi.  Gastrointestinal: Soft, tender to palpation over the RLQ and suprapubic region, and non distended with positive bowel sounds. No rebound or guarding. Genitourinary: No CVA tenderness. Musculoskeletal: Nontender with normal range of motion in all extremities. No edema, cyanosis, or erythema of extremities. Neurologic: Normal speech and language. Face is symmetric. Moving all extremities. No gross focal neurologic deficits are appreciated. Skin: Skin is warm, dry and intact. No rash noted. Psychiatric: Mood and affect are normal. Speech and behavior are normal.  ____________________________________________   LABS (all labs ordered are listed, but only abnormal results are displayed)  Labs Reviewed  COMPREHENSIVE METABOLIC PANEL - Abnormal; Notable for the following components:      Result Value   Chloride 114 (*)    Creatinine, Ser 1.20 (*)    Calcium 8.4 (*)    Total Protein 6.3 (*)    GFR calc non Af Amer 53 (*)    All other components within normal limits  CBC - Abnormal; Notable for the following components:   WBC 3.7 (*)    RBC 5.13 (*)    All other components within normal limits  URINALYSIS, COMPLETE (UACMP) WITH MICROSCOPIC - Abnormal; Notable for the following components:   Color, Urine YELLOW (*)    APPearance HAZY (*)    Bacteria, UA RARE (*)    All other components within normal limits  LIPASE, BLOOD  POCT PREGNANCY, URINE   ____________________________________________  EKG  none  ____________________________________________  RADIOLOGY  I have personally reviewed the images performed during this visit and I agree with the Radiologist's read.   Interpretation by Radiologist:  Ct Abdomen Pelvis W Contrast  Result Date: 05/13/2018 CLINICAL DATA:  Right-sided pelvic pain beginning yesterday. EXAM: CT ABDOMEN AND PELVIS WITH CONTRAST TECHNIQUE: Multidetector CT imaging of the abdomen and pelvis was performed using  the standard protocol following bolus administration of intravenous contrast. CONTRAST:  ISOVUE-300 IOPAMIDOL (ISOVUE-300) INJECTION 61% COMPARISON:  None. FINDINGS: Lower chest: No acute abnormality. Hepatobiliary: Multiple stones are seen in the gallbladder without wall thickening or adjacent stranding. No liver masses are noted. The portal vein is patent. Pancreas: Unremarkable. No pancreatic ductal dilatation or surrounding inflammatory changes. Spleen: Normal in size without focal abnormality. Adrenals/Urinary Tract: Adrenal glands are unremarkable. Kidneys are normal, without renal calculi, focal lesion, or hydronephrosis. Bladder is unremarkable. Stomach/Bowel: Stomach and small bowel are normal. Scattered colonic diverticuli are identified. The colon is otherwise normal. Visualized portions of the appendix are normal with no secondary evidence of appendicitis. Vascular/Lymphatic: Atherosclerotic changes are seen in the non aneurysmal aorta. No adenopathy. Reproductive: The uterus and left ovary are normal. The right ovary is larger  than the left and may contain multiple small follicles. Other: No abdominal wall hernia or abnormality. No abdominopelvic ascites. Musculoskeletal: No acute or significant osseous findings. IMPRESSION: 1. The right ovary is larger than the left and may contain multiple small follicles. It is not grossly abnormal however. Pelvic ultrasound could better evaluate the uterus and ovaries if clinically warranted. 2. Cholelithiasis with no wall thickening or pericholecystic fluid identified. 3. Scattered colonic diverticuli without diverticulitis. 4. Atherosclerotic changes in the nonaneurysmal abdominal aorta. Electronically Signed   By: Gerome Sam III M.D   On: 05/13/2018 20:38      ____________________________________________   PROCEDURES  Procedure(s) performed: None Procedures Critical Care performed:   None ____________________________________________   INITIAL IMPRESSION / ASSESSMENT AND PLAN / ED COURSE  47 y.o. female with history of seizures and a prior stroke who presents for evaluation of right lower quadrant abdominal pain since 4 PM with no associated symptoms.  Patient is well-appearing and in no distress, she has normal vital signs, abdomen is obese, soft, with suprapubic and right lower quadrant tenderness, no rebound or guarding.  Differential diagnoses including ovarian pathology versus appendicitis versus UTI versus kidney stone versus STD versus PID.  Discussed pelvic exam to eval for STD/ PID and better evaluation of ovaries. Patient refused pelvic exam.  Labs including CBC, CMP, lipase, urinalysis are pending.  Anticipate CT abdomen pelvis.    _________________________ 8:45 PM on 05/13/2018 -----------------------------------------  CT showing normal appendix.  Also showed a large right ovary.  Transvaginal ultrasound with Doppler studies are pending to rule out ovarian torsion, or ovarian cyst.  Care transferred to Dr. Sharma Covert.   As part of my medical decision making, I reviewed the following data within the electronic MEDICAL RECORD NUMBER Nursing notes reviewed and incorporated, Labs reviewed , Old chart reviewed, Radiograph reviewed , Notes from prior ED visits and [place] Controlled Substance Database    Pertinent labs & imaging results that were available during my care of the patient were reviewed by me and considered in my medical decision making (see chart for details).    ____________________________________________   FINAL CLINICAL IMPRESSION(S) / ED DIAGNOSES  Final diagnoses:  RLQ abdominal pain      NEW MEDICATIONS STARTED DURING THIS VISIT:  ED Discharge Orders    None       Note:  This document was prepared using Dragon voice recognition software and may include unintentional dictation errors.    Nita Sickle, MD 05/13/18 2046

## 2018-05-13 NOTE — Discharge Instructions (Addendum)
These return to the emergency department if you develop severe pain, lightheadedness or fainting, fever, inability to keep down fluids, or any other symptoms concerning to you.

## 2018-05-13 NOTE — ED Notes (Signed)
IV x2 attempts, unsuccessful 

## 2018-05-13 NOTE — ED Notes (Signed)
Pt c/o right side pelvic pain that started yesterday. Pt denies any n/v/d or urinary problems.

## 2018-05-13 NOTE — ED Notes (Signed)
Pt is in ultrasound.

## 2018-05-13 NOTE — ED Triage Notes (Signed)
Pt states R pelvic pain since yesterday. Thinks that this pain is a reaction to starting lamictal on 10/8. Denies vaginal bleeding. Denies urinary symptoms. Denies N&V&D& fever since pain began.

## 2018-07-18 DIAGNOSIS — Z6841 Body Mass Index (BMI) 40.0 and over, adult: Secondary | ICD-10-CM | POA: Diagnosis not present

## 2018-07-18 DIAGNOSIS — G40909 Epilepsy, unspecified, not intractable, without status epilepticus: Secondary | ICD-10-CM | POA: Diagnosis not present

## 2018-07-18 DIAGNOSIS — Z8673 Personal history of transient ischemic attack (TIA), and cerebral infarction without residual deficits: Secondary | ICD-10-CM | POA: Diagnosis not present

## 2018-07-18 DIAGNOSIS — E7800 Pure hypercholesterolemia, unspecified: Secondary | ICD-10-CM | POA: Diagnosis not present

## 2018-07-18 DIAGNOSIS — Z720 Tobacco use: Secondary | ICD-10-CM | POA: Diagnosis not present

## 2018-07-18 DIAGNOSIS — Z9181 History of falling: Secondary | ICD-10-CM | POA: Diagnosis not present

## 2018-07-18 DIAGNOSIS — Z0000 Encounter for general adult medical examination without abnormal findings: Secondary | ICD-10-CM | POA: Diagnosis not present

## 2018-07-30 DIAGNOSIS — F3342 Major depressive disorder, recurrent, in full remission: Secondary | ICD-10-CM | POA: Diagnosis not present

## 2018-07-30 DIAGNOSIS — R0683 Snoring: Secondary | ICD-10-CM | POA: Diagnosis not present

## 2018-07-30 DIAGNOSIS — R569 Unspecified convulsions: Secondary | ICD-10-CM | POA: Diagnosis not present

## 2018-07-30 DIAGNOSIS — Z8673 Personal history of transient ischemic attack (TIA), and cerebral infarction without residual deficits: Secondary | ICD-10-CM | POA: Diagnosis not present

## 2018-07-30 DIAGNOSIS — R400 Somnolence: Secondary | ICD-10-CM | POA: Diagnosis not present

## 2018-07-30 DIAGNOSIS — F458 Other somatoform disorders: Secondary | ICD-10-CM | POA: Diagnosis not present

## 2018-07-30 DIAGNOSIS — Z720 Tobacco use: Secondary | ICD-10-CM | POA: Diagnosis not present

## 2018-07-30 DIAGNOSIS — G40909 Epilepsy, unspecified, not intractable, without status epilepticus: Secondary | ICD-10-CM | POA: Diagnosis not present

## 2018-08-06 DIAGNOSIS — M79605 Pain in left leg: Secondary | ICD-10-CM | POA: Diagnosis not present

## 2018-08-06 DIAGNOSIS — M25532 Pain in left wrist: Secondary | ICD-10-CM | POA: Diagnosis not present

## 2018-08-06 DIAGNOSIS — R569 Unspecified convulsions: Secondary | ICD-10-CM | POA: Diagnosis not present

## 2018-08-06 DIAGNOSIS — M25531 Pain in right wrist: Secondary | ICD-10-CM | POA: Diagnosis not present

## 2018-08-06 DIAGNOSIS — I639 Cerebral infarction, unspecified: Secondary | ICD-10-CM | POA: Diagnosis not present

## 2018-08-27 ENCOUNTER — Ambulatory Visit: Payer: Medicare HMO | Attending: Internal Medicine

## 2018-08-27 DIAGNOSIS — G4730 Sleep apnea, unspecified: Secondary | ICD-10-CM | POA: Diagnosis not present

## 2018-08-27 DIAGNOSIS — G4733 Obstructive sleep apnea (adult) (pediatric): Secondary | ICD-10-CM | POA: Insufficient documentation

## 2018-11-26 DIAGNOSIS — I10 Essential (primary) hypertension: Secondary | ICD-10-CM | POA: Insufficient documentation

## 2019-04-14 ENCOUNTER — Ambulatory Visit: Payer: Medicare Other | Attending: Internal Medicine

## 2019-04-14 DIAGNOSIS — G4733 Obstructive sleep apnea (adult) (pediatric): Secondary | ICD-10-CM | POA: Diagnosis not present

## 2019-04-15 ENCOUNTER — Other Ambulatory Visit: Payer: Self-pay

## 2019-08-06 ENCOUNTER — Other Ambulatory Visit: Payer: Self-pay | Admitting: Internal Medicine

## 2019-08-06 DIAGNOSIS — N6489 Other specified disorders of breast: Secondary | ICD-10-CM

## 2019-09-18 ENCOUNTER — Ambulatory Visit: Payer: Self-pay | Attending: Internal Medicine

## 2019-09-18 DIAGNOSIS — Z23 Encounter for immunization: Secondary | ICD-10-CM

## 2019-09-18 NOTE — Progress Notes (Signed)
   Covid-19 Vaccination Clinic  Name:  Dawn Stein    MRN: [id number] DOB: [birth year]  09/18/2019  [name] was observed post Covid-19 immunization for 15 minutes without incident. She was provided with Vaccine Information Sheet and instruction to access the V-Safe system.   [name] was instructed to call 911 with any severe reactions post vaccine: Marland Kitchen Difficulty breathing  . Swelling of face and throat  . A fast heartbeat  . A bad rash all over body  . Dizziness and weakness   Immunizations Administered    Name Date Dose VIS Date Route   Pfizer COVID-19 Vaccine 09/18/2019  5:47 PM 0.3 mL 06/12/2019 Intramuscular   Manufacturer: [employer], [employer]   Lot: [id number]   NDC: [address]-0

## 2019-10-09 ENCOUNTER — Ambulatory Visit: Payer: Self-pay | Attending: Internal Medicine

## 2019-10-09 DIAGNOSIS — Z23 Encounter for immunization: Secondary | ICD-10-CM

## 2019-10-09 NOTE — Progress Notes (Signed)
   Covid-19 Vaccination Clinic  Name:  Dawn Stein    MRN: [id number] DOB: 25-Dec-1970  10/09/2019  Dawn Stein was observed post Covid-19 immunization for 15 minutes without incident. She was provided with Vaccine Information Sheet and instruction to access the V-Safe system.   Dawn Stein was instructed to call 911 with any severe reactions post vaccine: Marland Kitchen Difficulty breathing  . Swelling of face and throat  . A fast heartbeat  . A bad rash all over body  . Dizziness and weakness   Immunizations Administered    Name Date Dose VIS Date Route   Pfizer COVID-19 Vaccine 10/09/2019  4:28 PM 0.3 mL 06/12/2019 Intramuscular   Manufacturer: [employer], [employer]   Lot: [id number]   NDC: [address]-0

## 2020-01-30 ENCOUNTER — Other Ambulatory Visit: Payer: Self-pay

## 2020-01-30 ENCOUNTER — Emergency Department: Payer: Medicare Other

## 2020-01-30 ENCOUNTER — Encounter: Payer: Self-pay | Admitting: Internal Medicine

## 2020-01-30 ENCOUNTER — Observation Stay
Admission: EM | Admit: 2020-01-30 | Discharge: 2020-01-31 | Disposition: A | Discharge status: Home / Self Care | Payer: Medicare Other | Attending: Internal Medicine | Admitting: Internal Medicine

## 2020-01-30 DIAGNOSIS — I639 Cerebral infarction, unspecified: Secondary | ICD-10-CM | POA: Diagnosis present

## 2020-01-30 DIAGNOSIS — J189 Pneumonia, unspecified organism: Principal | ICD-10-CM | POA: Insufficient documentation

## 2020-01-30 DIAGNOSIS — F329 Major depressive disorder, single episode, unspecified: Secondary | ICD-10-CM | POA: Insufficient documentation

## 2020-01-30 DIAGNOSIS — Z79899 Other long term (current) drug therapy: Secondary | ICD-10-CM | POA: Insufficient documentation

## 2020-01-30 DIAGNOSIS — J9601 Acute respiratory failure with hypoxia: Secondary | ICD-10-CM | POA: Diagnosis not present

## 2020-01-30 DIAGNOSIS — N183 Chronic kidney disease, stage 3 unspecified: Secondary | ICD-10-CM | POA: Diagnosis present

## 2020-01-30 DIAGNOSIS — N1831 Chronic kidney disease, stage 3a: Secondary | ICD-10-CM | POA: Diagnosis not present

## 2020-01-30 DIAGNOSIS — R569 Unspecified convulsions: Secondary | ICD-10-CM

## 2020-01-30 DIAGNOSIS — R05 Cough: Secondary | ICD-10-CM | POA: Diagnosis present

## 2020-01-30 DIAGNOSIS — Z7982 Long term (current) use of aspirin: Secondary | ICD-10-CM | POA: Insufficient documentation

## 2020-01-30 DIAGNOSIS — I129 Hypertensive chronic kidney disease with stage 1 through stage 4 chronic kidney disease, or unspecified chronic kidney disease: Secondary | ICD-10-CM | POA: Diagnosis not present

## 2020-01-30 DIAGNOSIS — F17210 Nicotine dependence, cigarettes, uncomplicated: Secondary | ICD-10-CM | POA: Insufficient documentation

## 2020-01-30 DIAGNOSIS — E785 Hyperlipidemia, unspecified: Secondary | ICD-10-CM | POA: Diagnosis present

## 2020-01-30 DIAGNOSIS — F32A Depression, unspecified: Secondary | ICD-10-CM | POA: Diagnosis present

## 2020-01-30 DIAGNOSIS — Z720 Tobacco use: Secondary | ICD-10-CM | POA: Diagnosis present

## 2020-01-30 DIAGNOSIS — Z20822 Contact with and (suspected) exposure to covid-19: Secondary | ICD-10-CM | POA: Insufficient documentation

## 2020-01-30 DIAGNOSIS — E876 Hypokalemia: Secondary | ICD-10-CM | POA: Diagnosis present

## 2020-01-30 DIAGNOSIS — G40909 Epilepsy, unspecified, not intractable, without status epilepticus: Secondary | ICD-10-CM

## 2020-01-30 DIAGNOSIS — R519 Headache, unspecified: Secondary | ICD-10-CM | POA: Diagnosis not present

## 2020-01-30 LAB — BASIC METABOLIC PANEL
Anion gap: 8 (ref 5–15)
BUN: 13 mg/dL (ref 6–20)
CO2: 21 mmol/L — ABNORMAL LOW (ref 22–32)
Calcium: 7.8 mg/dL — ABNORMAL LOW (ref 8.9–10.3)
Chloride: 105 mmol/L (ref 98–111)
Creatinine, Ser: 1.17 mg/dL — ABNORMAL HIGH (ref 0.44–1.00)
GFR calc Af Amer: >60 mL/min (ref >60)
GFR calc non Af Amer: 55 mL/min — ABNORMAL LOW (ref >60)
Glucose, Bld: 111 mg/dL — ABNORMAL HIGH (ref 70–99)
Potassium: 3.4 mmol/L — ABNORMAL LOW (ref 3.5–5.1)
Sodium: 134 mmol/L — ABNORMAL LOW (ref 135–145)

## 2020-01-30 LAB — URINALYSIS, COMPLETE (UACMP) WITH MICROSCOPIC
Bilirubin Urine: NEGATIVE
Glucose, UA: NEGATIVE mg/dL
Hgb urine dipstick: NEGATIVE
Ketones, ur: NEGATIVE mg/dL
Leukocytes,Ua: NEGATIVE
Nitrite: NEGATIVE
Protein, ur: NEGATIVE mg/dL
Specific Gravity, Urine: 1.014 (ref 1.005–1.030)
pH: 6 (ref 5.0–8.0)

## 2020-01-30 LAB — CBC
HCT: 38 % (ref 36.0–46.0)
Hemoglobin: 12.2 g/dL (ref 12.0–15.0)
MCH: 26.9 pg (ref 26.0–34.0)
MCHC: 32.1 g/dL (ref 30.0–36.0)
MCV: 83.7 fL (ref 80.0–100.0)
Platelets: 185 10*3/uL (ref 150–400)
RBC: 4.54 MIL/uL (ref 3.87–5.11)
RDW: 15 % (ref 11.5–15.5)
WBC: 8.8 10*3/uL (ref 4.0–10.5)
nRBC: 0 % (ref 0.0–0.2)

## 2020-01-30 LAB — RESPIRATORY PANEL BY RT PCR (FLU A&B, COVID)
Influenza A by PCR: NEGATIVE
Influenza B by PCR: NEGATIVE
SARS Coronavirus 2 by RT PCR: NEGATIVE

## 2020-01-30 LAB — POCT PREGNANCY, URINE: Preg Test, Ur: NEGATIVE

## 2020-01-30 MED ORDER — VENLAFAXINE HCL ER 75 MG PO CP24
75.0000 mg | ORAL_CAPSULE | Freq: Every day | ORAL | Status: DC
  Administered 2020-01-30 – 2020-01-31 (×2): 75 mg via ORAL
  Filled 2020-01-30 (×2): qty 1

## 2020-01-30 MED ORDER — ATORVASTATIN CALCIUM 10 MG PO TABS
10.0000 mg | ORAL_TABLET | Freq: Every day | ORAL | Status: DC
  Administered 2020-01-30 – 2020-01-31 (×2): 10 mg via ORAL
  Filled 2020-01-30 (×2): qty 1

## 2020-01-30 MED ORDER — DM-GUAIFENESIN ER 30-600 MG PO TB12
1.0000 | ORAL_TABLET | Freq: Two times a day (BID) | ORAL | Status: DC | PRN
  Administered 2020-01-31: 1 via ORAL
  Filled 2020-01-30: qty 1

## 2020-01-30 MED ORDER — LEVETIRACETAM 750 MG PO TABS
750.0000 mg | ORAL_TABLET | Freq: Two times a day (BID) | ORAL | Status: DC
  Administered 2020-01-30: 750 mg via ORAL
  Filled 2020-01-30 (×2): qty 1

## 2020-01-30 MED ORDER — DIVALPROEX SODIUM ER 250 MG PO TB24
250.0000 mg | ORAL_TABLET | Freq: Every day | ORAL | Status: DC
  Administered 2020-01-30 – 2020-01-31 (×2): 250 mg via ORAL
  Filled 2020-01-30 (×2): qty 1

## 2020-01-30 MED ORDER — ENOXAPARIN SODIUM 40 MG/0.4ML ~~LOC~~ SOLN
40.0000 mg | Freq: Two times a day (BID) | SUBCUTANEOUS | Status: DC
  Administered 2020-01-30 – 2020-01-31 (×3): 40 mg via SUBCUTANEOUS
  Filled 2020-01-30 (×3): qty 0.4

## 2020-01-30 MED ORDER — AZITHROMYCIN 500 MG PO TABS: 500.0000 mg | ORAL_TABLET | Freq: Once | ORAL | Status: DC

## 2020-01-30 MED ORDER — AMLODIPINE BESYLATE 5 MG PO TABS
5.0000 mg | ORAL_TABLET | Freq: Every day | ORAL | Status: DC
  Administered 2020-01-31: 5 mg via ORAL
  Filled 2020-01-30: qty 1

## 2020-01-30 MED ORDER — SODIUM CHLORIDE 0.9 % IV SOLN
1.0000 g | INTRAVENOUS | Status: DC
  Administered 2020-01-31: 1 g via INTRAVENOUS
  Filled 2020-01-30: qty 10
  Filled 2020-01-30: qty 1

## 2020-01-30 MED ORDER — ACETAMINOPHEN 325 MG PO TABS: 650.0000 mg | ORAL_TABLET | Freq: Four times a day (QID) | ORAL | Status: DC | PRN

## 2020-01-30 MED ORDER — CLOPIDOGREL BISULFATE 75 MG PO TABS
75.0000 mg | ORAL_TABLET | Freq: Every day | ORAL | Status: DC
  Administered 2020-01-30 – 2020-01-31 (×2): 75 mg via ORAL
  Filled 2020-01-30 (×2): qty 1

## 2020-01-30 MED ORDER — SODIUM CHLORIDE 0.9 % IV SOLN
1.0000 g | Freq: Once | INTRAVENOUS | Status: AC
  Administered 2020-01-30: 1 g via INTRAVENOUS
  Filled 2020-01-30: qty 10

## 2020-01-30 MED ORDER — AZITHROMYCIN 500 MG PO TABS
500.0000 mg | ORAL_TABLET | Freq: Once | ORAL | Status: AC
  Administered 2020-01-30: 500 mg via ORAL
  Filled 2020-01-30: qty 1

## 2020-01-30 MED ORDER — LAMOTRIGINE 100 MG PO TABS
200.0000 mg | ORAL_TABLET | Freq: Every evening | ORAL | Status: DC
  Filled 2020-01-30 (×2): qty 2

## 2020-01-30 MED ORDER — AZITHROMYCIN 500 MG PO TABS
500.0000 mg | ORAL_TABLET | Freq: Once | ORAL | Status: AC
  Administered 2020-01-31: 500 mg via ORAL
  Filled 2020-01-30: qty 1

## 2020-01-30 MED ORDER — LORAZEPAM 2 MG/ML IJ SOLN: 1.0000 mg | INTRAMUSCULAR | Status: DC | PRN

## 2020-01-30 MED ORDER — DIVALPROEX SODIUM ER 250 MG PO TB24: 250.0000 mg | ORAL_TABLET | Freq: Every day | ORAL | Status: DC

## 2020-01-30 MED ORDER — NICOTINE 21 MG/24HR TD PT24
21.0000 mg | MEDICATED_PATCH | Freq: Every day | TRANSDERMAL | Status: DC
  Administered 2020-01-31: 21 mg via TRANSDERMAL
  Filled 2020-01-30 (×2): qty 1

## 2020-01-30 MED ORDER — ASPIRIN EC 81 MG PO TBEC
81.0000 mg | DELAYED_RELEASE_TABLET | Freq: Every day | ORAL | Status: DC
  Administered 2020-01-30 – 2020-01-31 (×2): 81 mg via ORAL
  Filled 2020-01-30 (×2): qty 1

## 2020-01-30 MED ORDER — ACETAMINOPHEN 500 MG PO TABS
1000.0000 mg | ORAL_TABLET | Freq: Once | ORAL | Status: AC
  Administered 2020-01-30: 1000 mg via ORAL
  Filled 2020-01-30: qty 2

## 2020-01-30 MED ORDER — LAMOTRIGINE 25 MG PO TABS
100.0000 mg | ORAL_TABLET | Freq: Every morning | ORAL | Status: DC
  Administered 2020-01-31: 100 mg via ORAL

## 2020-01-30 MED ORDER — POTASSIUM CHLORIDE CRYS ER 20 MEQ PO TBCR
20.0000 meq | EXTENDED_RELEASE_TABLET | Freq: Once | ORAL | Status: AC
  Administered 2020-01-30: 20 meq via ORAL
  Filled 2020-01-30: qty 1

## 2020-01-30 MED ORDER — AMOXICILLIN-POT CLAVULANATE 875-125 MG PO TABS: 1.0000 | ORAL_TABLET | Freq: Once | ORAL | Status: DC

## 2020-01-30 MED ORDER — ONDANSETRON HCL 4 MG/2ML IJ SOLN: 4.0000 mg | Freq: Three times a day (TID) | INTRAMUSCULAR | Status: DC | PRN

## 2020-01-30 MED ORDER — ALBUTEROL SULFATE (2.5 MG/3ML) 0.083% IN NEBU: 2.5000 mg | INHALATION_SOLUTION | RESPIRATORY_TRACT | Status: DC | PRN

## 2020-01-30 MED ORDER — LEVETIRACETAM 750 MG PO TABS: 750.0000 mg | ORAL_TABLET | Freq: Two times a day (BID) | ORAL | Status: DC

## 2020-01-30 NOTE — H&P (Signed)
History and Physical    Dawn Stein LPF:[id number] DOB: 1971-06-10 DOA: 01/30/2020  Referring MD/NP/PA:   PCP: Barbette Reichmann, MD   Patient coming from:  The patient is coming from home.  At baseline, pt is independent for most of ADL.        Chief Complaint: Cough, shortness breath  HPI: Dawn Stein is a 49 y.o. female with medical history significant of stroke, seizure, hyperlipidemia, depression, closed head injury, tobacco abuse, CKD stage III, who presents with cough, shortness breath.  Patient states that her symptoms have been going on for more than 2 days, including cough with brownish colored sputum production, nasal congestion, mild shortness breath.  Patient also has mild fever and chills.  Denies chest pain.  She also reports some headache.  No nausea, vomiting, diarrhea, abdominal pain, symptoms of UTI or unilateral weakness.  Patient was found to have oxygen desaturation to 88% on room air transiently in ED, which later on improved to 99% on room air.  ED Course: pt was found to have WBC 8.8, negative COVID-19 PCR, potassium 3.4, stable renal function, negative pregnancy test, negative urinalysis, temperature 100.6, blood pressure 104/51, heart rate 89, RR 18, chest x-ray showed bilateral lower lobe multifocal infiltration.  Patient is placed on MedSurg bed for observation  Review of Systems:   General: has fevers, chills, no body weight gain, has fatigue and headache HEENT: no blurry vision, hearing changes or sore throat Respiratory: has dyspnea, coughing, no wheezing CV: no chest pain, no palpitations GI: no nausea, vomiting, abdominal pain, diarrhea, constipation GU: no dysuria, burning on urination, increased urinary frequency, hematuria  Ext: no leg edema Neuro: no unilateral weakness, numbness, or tingling, no vision change or hearing loss Skin: no rash, no skin tear. MSK: No muscle spasm, no deformity, no limitation of range of movement  in spin Heme: No easy bruising.  Travel history: No recent long distant travel.  Allergy:  Allergies  Allergen Reactions  . Tetracyclines & Related Anaphylaxis    Facial swelling    Past Medical History:  Diagnosis Date  . Closed head injury    at age 55  . Seizures (HCC)   . Stroke [facility])     Past Surgical History:  Procedure Laterality Date  . none      Social History:  reports that she has been smoking. She has never used smokeless tobacco. She reports that she does not drink alcohol and does not use drugs.  Family History:  Family History  Problem Relation Age of Onset  . Seizures Mother   . Lung cancer Maternal Grandfather   . Breast cancer Maternal Aunt      Prior to Admission medications   Medication Sig Start Date End Date Taking? Authorizing Provider  aspirin EC 81 MG tablet Take 81 mg by mouth daily.    [provider]  atorvastatin (LIPITOR) 10 MG tablet Take 10 mg by mouth daily. 09/03/16 09/03/17  [provider]  divalproex (DEPAKOTE ER) 250 MG 24 hr tablet Take 250 mg by mouth daily.     [provider]  levETIRAcetam (KEPPRA) 750 MG tablet Take 1 tablet (750 mg total) by mouth 2 (two) times daily at 8 am and 1 pm. Patient taking differently: Take 750 mg by mouth 2 (two) times daily.  10/26/16   Enedina Finner, MD  levETIRAcetam (KEPPRA) 750 MG tablet Take 2 tablets (1,500 mg total) by mouth at bedtime. Patient not taking: Reported on 01/09/2017 10/26/16  Enedina Finner, MD  sertraline (ZOLOFT) 50 MG tablet Take 50 mg by mouth daily.     [provider]    Physical Exam: Vitals:   01/30/20 0107 01/30/20 0502 01/30/20 0748  BP: 111/81 (!) 104/51 (!) 98/59  Pulse: 89 77 65  Resp: 16 18 12   Temp: 99 F (37.2 C) (!) 100.6 F (38.1 C) 98.1 F (36.7 C)  TempSrc: Oral Oral Oral  SpO2: 96% 99% 98%  Weight: (!) 113.4 kg    Height: 5\' 2"  (1.575 m)     General: Not in acute distress HEENT:       Eyes: PERRL, EOMI, no scleral  icterus.       ENT: No discharge from the ears and nose, no pharynx injection, no tonsillar enlargement.        Neck: No JVD, no bruit, no mass felt. Heme: No neck lymph node enlargement. Cardiac: S1/S2, RRR, No murmurs, No gallops or rubs. Respiratory: No rales, wheezing, rhonchi or rubs. GI: Soft, nondistended, nontender, no rebound pain, no organomegaly, BS present. GU: No hematuria Ext: No pitting leg edema bilaterally. 2+DP/PT pulse bilaterally. Musculoskeletal: No joint deformities, No joint redness or warmth, no limitation of ROM in spin. Skin: No rashes.  Neuro: Alert, oriented X3, cranial nerves II-XII grossly intact, moves all extremities. Psych: Patient is not psychotic, no suicidal or hemocidal ideation.  Labs on Admission: I have personally reviewed following labs and imaging studies  CBC: Recent Labs  Lab 01/30/20 0123  WBC 8.8  HGB 12.2  HCT 38.0  MCV 83.7  PLT 185   Basic Metabolic Panel: Recent Labs  Lab 01/30/20 0123  NA 134*  K 3.4*  CL 105  CO2 21*  GLUCOSE 111*  BUN 13  CREATININE 1.17*  CALCIUM 7.8*   GFR: Estimated Creatinine Clearance: 70 mL/min (A) (by C-G formula based on SCr of 1.17 mg/dL (H)). Liver Function Tests: No results for input(s): AST, ALT, ALKPHOS, BILITOT, PROT, ALBUMIN in the last 168 hours. No results for input(s): LIPASE, AMYLASE in the last 168 hours. No results for input(s): AMMONIA in the last 168 hours. Coagulation Profile: No results for input(s): INR, PROTIME in the last 168 hours. Cardiac Enzymes: No results for input(s): CKTOTAL, CKMB, CKMBINDEX, TROPONINI in the last 168 hours. BNP (last 3 results) No results for input(s): PROBNP in the last 8760 hours. HbA1C: No results for input(s): HGBA1C in the last 72 hours. CBG: No results for input(s): GLUCAP in the last 168 hours. Lipid Profile: No results for input(s): CHOL, HDL, LDLCALC, TRIG, CHOLHDL, LDLDIRECT in the last 72 hours. Thyroid Function Tests: No  results for input(s): TSH, T4TOTAL, FREET4, T3FREE, THYROIDAB in the last 72 hours. Anemia Panel: No results for input(s): VITAMINB12, FOLATE, FERRITIN, TIBC, IRON, RETICCTPCT in the last 72 hours. Urine analysis:    Component Value Date/Time   COLORURINE YELLOW (A) 01/30/2020 0123   APPEARANCEUR HAZY (A) 01/30/2020 0123   APPEARANCEUR Clear 02/01/2014 2340   LABSPEC 1.014 01/30/2020 0123   LABSPEC 1.012 02/01/2014 2340   PHURINE 6.0 01/30/2020 0123   GLUCOSEU NEGATIVE 01/30/2020 0123   GLUCOSEU Negative 02/01/2014 2340   HGBUR NEGATIVE 01/30/2020 0123   BILIRUBINUR NEGATIVE 01/30/2020 0123   BILIRUBINUR Negative 02/01/2014 2340   KETONESUR NEGATIVE 01/30/2020 0123   PROTEINUR NEGATIVE 01/30/2020 0123   NITRITE NEGATIVE 01/30/2020 0123   LEUKOCYTESUR NEGATIVE 01/30/2020 0123   LEUKOCYTESUR Negative 02/01/2014 2340   Sepsis Labs: @LABRCNTIP (procalcitonin:4,lacticidven:4) ) Recent Results (from the past 240 hour(s))  Respiratory Panel by RT PCR (Flu A&B, Covid) - Nasopharyngeal Swab     Status: None   Collection Time: 01/30/20  5:01 AM   Specimen: Nasopharyngeal Swab  Result Value Ref Range Status   SARS Coronavirus 2 by RT PCR NEGATIVE NEGATIVE Final    Comment: (NOTE) SARS-CoV-2 target nucleic acids are NOT DETECTED.  The SARS-CoV-2 RNA is generally detectable in upper respiratoy specimens during the acute phase of infection. The lowest concentration of SARS-CoV-2 viral copies this assay can detect is 131 copies/mL. A negative result does not preclude SARS-Cov-2 infection and should not be used as the sole basis for treatment or other patient management decisions. A negative result may occur with  improper specimen collection/handling, submission of specimen other than nasopharyngeal swab, presence of viral mutation(s) within the areas targeted by this assay, and inadequate number of viral copies (<131 copies/mL). A negative result must be combined with  clinical observations, patient history, and epidemiological information. The expected result is Negative.  Fact Sheet for Patients:  https://www.moore.com/  Fact Sheet for Healthcare Providers:  https://www.young.biz/  This test is no t yet approved or cleared by the [place] FDA and  has been authorized for detection and/or diagnosis of SARS-CoV-2 by FDA under an Emergency Use Authorization (EUA). This EUA will remain  in effect (meaning this test can be used) for the duration of the COVID-19 declaration under Section 564(b)(1) of the Act, 21 U.S.C. section 360bbb-3(b)(1), unless the authorization is terminated or revoked sooner.     Influenza A by PCR NEGATIVE NEGATIVE Final   Influenza B by PCR NEGATIVE NEGATIVE Final    Comment: (NOTE) The Xpert Xpress SARS-CoV-2/FLU/RSV assay is intended as an aid in  the diagnosis of influenza from Nasopharyngeal swab specimens and  should not be used as a sole basis for treatment. Nasal washings and  aspirates are unacceptable for Xpert Xpress SARS-CoV-2/FLU/RSV  testing.  Fact Sheet for Patients: https://www.moore.com/  Fact Sheet for Healthcare Providers: https://www.young.biz/  This test is not yet approved or cleared by the [place] FDA and  has been authorized for detection and/or diagnosis of SARS-CoV-2 by  FDA under an Emergency Use Authorization (EUA). This EUA will remain  in effect (meaning this test can be used) for the duration of the  Covid-19 declaration under Section 564(b)(1) of the Act, 21  U.S.C. section 360bbb-3(b)(1), unless the authorization is  terminated or revoked. Performed at [facility], [address] [place], [place] [address]      Radiological Exams on Admission: DG Chest 2 View  Result Date: 01/30/2020 CLINICAL DATA:  Productive cough EXAM: CHEST - 2 VIEW COMPARISON:  None. FINDINGS: The heart size  and mediastinal contours are within normal limits. Patchy airspace opacities are seen within the left lower lung and periphery of the right lung. No pleural effusion is seen. No acute osseous abnormality. IMPRESSION: Multifocal patchy airspace opacities seen within both lower lungs, left greater than right, likely consistent with multifocal pneumonia. Electronically Signed   By: Jonna Clark M.D.   On: 01/30/2020 01:56   CT Head Wo Contrast  Result Date: 01/30/2020 CLINICAL DATA:  Headaches for 2 days EXAM: CT HEAD WITHOUT CONTRAST TECHNIQUE: Contiguous axial images were obtained from the base of the skull through the vertex without intravenous contrast. COMPARISON:  10/25/2016 FINDINGS: Brain: No evidence of acute infarction, hemorrhage, hydrocephalus, extra-axial collection or mass lesion/mass effect. Scattered bilateral infarcts are again identified stable from the prior exam. These include the frontal lobes  bilaterally as well as the occipital lobes bilaterally and right cerebellum. Vascular: No hyperdense vessel or unexpected calcification. Skull: Normal. Negative for fracture or focal lesion. Sinuses/Orbits: No acute finding. Other: None. IMPRESSION: Chronic bilateral infarcts.  No acute abnormality is noted. Electronically Signed   By: Alcide CleverMark  Lukens M.D.   On: 01/30/2020 01:47     EKG: Independently reviewed.  Sinus rhythm, QTC 430, LAE, nonspecific T wave change  Assessment/Plan Principal Problem:   Multifocal pneumonia Active Problems:   CVA (cerebral vascular accident) (HCC)   Seizures (HCC)   HLD (hyperlipidemia)   Depression   Acute respiratory failure with hypoxia (HCC)   Tobacco abuse   Hypokalemia   CKD (chronic kidney disease), stage IIIa  Acute respiratory failure with hypoxia due to multifocal pneumonia: Negative COVID-19 PCR - Placed on MedSurg bed for observation - IV Rocephin and azithromycin - Mucinex for cough  - Bronchodilators - Urine legionella and S. pneumococcal  antigen - Follow up blood culture x2, sputum culture  CVA (cerebral vascular accident) (HCC) -Aspirin, Plavix, Lipitor  Seizure -Seizure precaution -When necessary Ativan for seizure -Continue Home medications: Lamictal and Depakote  HLD (hyperlipidemia) -Lipitor  Depression -Effexor  Tobacco abuse -Nicotine patch  Hypokalemia; K=3.4 -Repleted -Check magnesium level  CKD (chronic kidney disease), stage IIIa: stable -f/u by BMP  HTN: -Amlodipine      DVT ppx: SQ Lovenox Code Status: Full code Family Communication: not done, no family member is at bed side.    Disposition Plan:  Anticipate discharge back to previous environment Consults called:  none Admission status: Med-surg bed for obs   Status is: Observation  The patient remains OBS appropriate and will d/c before 2 midnights.  Dispo: The patient is from: Home              Anticipated d/c is to: Home              Anticipated d/c date is: 1 day              Patient currently is not medically stable to d/c.           Date of Service 01/30/2020    Lorretta HarpXilin Nikcole Eischeid [facility]   If 7PM-7AM, please contact night-coverage [email or web address] 01/30/2020, 9:33 AM

## 2020-01-30 NOTE — Progress Notes (Signed)
PHARMACIST - PHYSICIAN COMMUNICATION  CONCERNING:  Enoxaparin (Lovenox) for DVT Prophylaxis   RECOMMENDATION: Patient was prescribed enoxaprin 40mg  q24 hours for VTE prophylaxis.   Filed Weights   01/30/20 0107  Weight: (!) 113.4 kg (250 lb)    Body mass index is 45.73 kg/m.  Estimated Creatinine Clearance: 70 mL/min (A) (by C-G formula based on SCr of 1.17 mg/dL (H)).  Based on [facility] policy patient is candidate for enoxaparin 40mg  every 12 hour dosing due to BMI being >40.  DESCRIPTION: Pharmacy has adjusted enoxaparin dose per [facility] policy.  Patient is now receiving enoxaparin 40mg  every 12 hours.   [facility], PharmD, BCPS Clinical Pharmacist 01/30/2020 8:01 AM

## 2020-01-30 NOTE — ED Notes (Signed)
Pt called out and when tech entered room. Pt was standing in her urine with shorts down to her ankles. Pt was crying saying she couldn't hold her urine. She also had some incontinent stool on the floor and trash can. Tech cleaned pt and brought in bedside commode.   lw edt

## 2020-01-30 NOTE — ED Provider Notes (Signed)
[facility] Emergency Department Provider Note  ____________________________________________  Time seen: Approximately 5:45 AM  I have reviewed the triage vital signs and the nursing notes.   HISTORY  Chief Complaint Nasal Congestion and Headache   HPI Dawn Stein is a 49 y.o. female with a history of seizures, stroke who presents for evaluation of headache and cough.  Patient describes 2 days of left sided frontal headache which has been moderate in intensity and constant, cough productive of yellow/brown sputum, nausea, and congestion. No known fevers, sore throat, chest pain, shortness of breath, abdominal pain, vomiting or diarrhea.  No known exposures to Covid.   Past Medical History:  Diagnosis Date  . Closed head injury    at age 73  . Seizures (HCC)   . Stroke [facility])     Patient Active Problem List   Diagnosis Date Noted  . TIA (transient ischemic attack) 10/26/2016  . CVA (cerebral vascular accident) (HCC) 10/26/2016  . Slurred speech 04/08/2015    Past Surgical History:  Procedure Laterality Date  . none      Prior to Admission medications   Medication Sig Start Date End Date Taking? Authorizing Provider  aspirin EC 81 MG tablet Take 81 mg by mouth daily.    [provider]  atorvastatin (LIPITOR) 10 MG tablet Take 10 mg by mouth daily. 09/03/16 09/03/17  [provider]  divalproex (DEPAKOTE ER) 250 MG 24 hr tablet Take 250 mg by mouth daily.     [provider]  levETIRAcetam (KEPPRA) 750 MG tablet Take 1 tablet (750 mg total) by mouth 2 (two) times daily at 8 am and 1 pm. Patient taking differently: Take 750 mg by mouth 2 (two) times daily.  10/26/16   Enedina Finner, MD  levETIRAcetam (KEPPRA) 750 MG tablet Take 2 tablets (1,500 mg total) by mouth at bedtime. Patient not taking: Reported on 01/09/2017 10/26/16   Enedina Finner, MD  sertraline (ZOLOFT) 50 MG tablet Take 50 mg by mouth daily.     [provider]    Allergies Tetracyclines & related  Family History  Problem Relation Age of Onset  . Seizures Mother   . Lung cancer Maternal Grandfather   . Breast cancer Maternal Aunt     Social History Social History   Tobacco Use  . Smoking status: Current Every Day Smoker  . Smokeless tobacco: Never Used  Substance Use Topics  . Alcohol use: No  . Drug use: No    Review of Systems  Constitutional: Negative for fever. Eyes: Negative for visual changes. ENT: Negative for sore throat. + congestion Neck: No neck pain  Cardiovascular: Negative for chest pain. Respiratory: Negative for shortness of breath. + cough Gastrointestinal: Negative for abdominal pain, vomiting or diarrhea. + nausea Genitourinary: Negative for dysuria. Musculoskeletal: Negative for back pain. Skin: Negative for rash. Neurological: Negative for weakness or numbness. + HA Psych: No SI or HI  ____________________________________________   PHYSICAL EXAM:  VITAL SIGNS: Vitals:   01/30/20 0107 01/30/20 0502  BP: 111/81 (!) 104/51  Pulse: 89 77  Resp: 16 18  Temp: 99 F (37.2 C) (!) 100.6 F (38.1 C)  SpO2: 96% 99%    Constitutional: Alert and oriented. Well appearing and in no apparent distress. HEENT:      Head: Normocephalic and atraumatic.         Eyes: Conjunctivae are normal. Sclera is non-icteric.       Mouth/Throat: Mucous membranes are moist.  Neck: Supple with no signs of meningismus. Cardiovascular: Regular rate and rhythm. No murmurs, gallops, or rubs. 2+ symmetrical distal pulses are present in all extremities. No JVD. Respiratory: Normal respiratory effort. Lungs are clear to auscultation bilaterally. No wheezes, crackles, or rhonchi.  Gastrointestinal: Soft, non tender. Musculoskeletal: No edema, cyanosis, or erythema of extremities. Neurologic: Normal speech and language. Face is symmetric. Moving all extremities. No gross focal neurologic deficits are  appreciated. Skin: Skin is warm, dry and intact. No rash noted. Psychiatric: Mood and affect are normal. Speech and behavior are normal.  ____________________________________________   LABS (all labs ordered are listed, but only abnormal results are displayed)  Labs Reviewed  BASIC METABOLIC PANEL - Abnormal; Notable for the following components:      Result Value   Sodium 134 (*)    Potassium 3.4 (*)    CO2 21 (*)    Glucose, Bld 111 (*)    Creatinine, Ser 1.17 (*)    Calcium 7.8 (*)    GFR calc non Af Amer 55 (*)    All other components within normal limits  URINALYSIS, COMPLETE (UACMP) WITH MICROSCOPIC - Abnormal; Notable for the following components:   Color, Urine YELLOW (*)    APPearance HAZY (*)    Bacteria, UA RARE (*)    All other components within normal limits  RESPIRATORY PANEL BY RT PCR (FLU A&B, COVID)  CBC  POC URINE PREG, ED  POCT PREGNANCY, URINE  CBG MONITORING, ED   ____________________________________________  EKG  ED ECG REPORT I, Nita Sickle, the attending physician, personally viewed and interpreted this ECG.  Normal sinus rhythm, rate of 86, normal intervals, normal axis, no ST elevations or depressions. ____________________________________________  RADIOLOGY  I have personally reviewed the images performed during this visit and I agree with the Radiologist's read.   Interpretation by Radiologist:  DG Chest 2 View  Result Date: 01/30/2020 CLINICAL DATA:  Productive cough EXAM: CHEST - 2 VIEW COMPARISON:  None. FINDINGS: The heart size and mediastinal contours are within normal limits. Patchy airspace opacities are seen within the left lower lung and periphery of the right lung. No pleural effusion is seen. No acute osseous abnormality. IMPRESSION: Multifocal patchy airspace opacities seen within both lower lungs, left greater than right, likely consistent with multifocal pneumonia. Electronically Signed   By: Jonna Clark M.D.   On:  01/30/2020 01:56   CT Head Wo Contrast  Result Date: 01/30/2020 CLINICAL DATA:  Headaches for 2 days EXAM: CT HEAD WITHOUT CONTRAST TECHNIQUE: Contiguous axial images were obtained from the base of the skull through the vertex without intravenous contrast. COMPARISON:  10/25/2016 FINDINGS: Brain: No evidence of acute infarction, hemorrhage, hydrocephalus, extra-axial collection or mass lesion/mass effect. Scattered bilateral infarcts are again identified stable from the prior exam. These include the frontal lobes bilaterally as well as the occipital lobes bilaterally and right cerebellum. Vascular: No hyperdense vessel or unexpected calcification. Skull: Normal. Negative for fracture or focal lesion. Sinuses/Orbits: No acute finding. Other: None. IMPRESSION: Chronic bilateral infarcts.  No acute abnormality is noted. Electronically Signed   By: Alcide Clever M.D.   On: 01/30/2020 01:47     ____________________________________________   PROCEDURES  Procedure(s) performed:yes .1-3 Lead EKG Interpretation Performed by: Nita Sickle, MD Authorized by: Nita Sickle, MD     Interpretation: normal     ECG rate assessment: normal     Rhythm: sinus rhythm     Ectopy: none     Critical Care performed:  Yes  CRITICAL CARE Performed by: Nita Sickle  ?  Total critical care time: 40 min  Critical care time was exclusive of separately billable procedures and treating other patients.  Critical care was necessary to treat or prevent imminent or life-threatening deterioration.  Critical care was time spent personally by me on the following activities: development of treatment plan with patient and/or surrogate as well as nursing, discussions with consultants, evaluation of patient's response to treatment, examination of patient, obtaining history from patient or surrogate, ordering and performing treatments and interventions, ordering and review of laboratory studies, ordering and  review of radiographic studies, pulse oximetry and re-evaluation of patient's condition.  ____________________________________________   INITIAL IMPRESSION / ASSESSMENT AND PLAN / ED COURSE  49 y.o. female with a history of seizures, stroke who presents for evaluation of headache, congestion, and productive cough x 2 days.  Patient is well-appearing in no distress, initially with normal vital signs however did spike a temp of 100.42F orally.  Patient becomes hypoxic with sats in the mid 80s with ambulation.  Chest x-ray concerning for multifocal pneumonia, confirmed by radiology.  No signs of sepsis with no tachycardia, tachypnea, leukocytosis.  Labs with no significant electrolyte derangements.  Covid negative.  Will admit to Hospitalist.      _____________________________________________ Please note:  Patient was evaluated in Emergency Department today for the symptoms described in the history of present illness. Patient was evaluated in the context of the global COVID-19 pandemic, which necessitated consideration that the patient might be at risk for infection with the SARS-CoV-2 virus that causes COVID-19. Institutional protocols and algorithms that pertain to the evaluation of patients at risk for COVID-19 are in a state of rapid change based on information released by regulatory bodies including the CDC and federal and state organizations. These policies and algorithms were followed during the patient's care in the ED.  Some ED evaluations and interventions may be delayed as a result of limited staffing during the pandemic.   [place] Controlled Substance Database was reviewed by me. ____________________________________________   FINAL CLINICAL IMPRESSION(S) / ED DIAGNOSES   Final diagnoses:  Multifocal pneumonia  Acute respiratory failure with hypoxia (HCC)      NEW MEDICATIONS STARTED DURING THIS VISIT:  ED Discharge Orders    None       Note:  This document was prepared using  Dragon voice recognition software and may include unintentional dictation errors.    Don Perking, [place], MD 01/30/20 [phone]

## 2020-01-30 NOTE — ED Triage Notes (Addendum)
Pt to ED reporting headache and nasal congestion x 2 days. Pt is tearful in triage and reports a cough with "brown" sputum. Pt ambulatory but slow and states she is having trouble with the headache. No neuro deficits noted. NO SOB.   Pt also reports dizziness with a hx of strokes and seizures and states a hx of "only parts of my brain working"

## 2020-01-30 NOTE — ED Notes (Signed)
Walked Pt with pulse oximetry. Pt walked to discharge sign saturation decreased to 88% for about 5 seconds then increased to 97%. On the way back Pt saturation decreased to 87% for five seconds then increased to 97%.AS

## 2020-01-31 DIAGNOSIS — J189 Pneumonia, unspecified organism: Secondary | ICD-10-CM | POA: Diagnosis not present

## 2020-01-31 DIAGNOSIS — N1831 Chronic kidney disease, stage 3a: Secondary | ICD-10-CM | POA: Diagnosis not present

## 2020-01-31 DIAGNOSIS — J9601 Acute respiratory failure with hypoxia: Secondary | ICD-10-CM | POA: Diagnosis not present

## 2020-01-31 LAB — BASIC METABOLIC PANEL
Anion gap: 9 (ref 5–15)
BUN: 12 mg/dL (ref 6–20)
CO2: 24 mmol/L (ref 22–32)
Calcium: 7.9 mg/dL — ABNORMAL LOW (ref 8.9–10.3)
Chloride: 106 mmol/L (ref 98–111)
Creatinine, Ser: 1.1 mg/dL — ABNORMAL HIGH (ref 0.44–1.00)
GFR calc Af Amer: >60 mL/min (ref >60)
GFR calc non Af Amer: 59 mL/min — ABNORMAL LOW (ref >60)
Glucose, Bld: 100 mg/dL — ABNORMAL HIGH (ref 70–99)
Potassium: 3.9 mmol/L (ref 3.5–5.1)
Sodium: 139 mmol/L (ref 135–145)

## 2020-01-31 LAB — CBC
HCT: 38.7 % (ref 36.0–46.0)
Hemoglobin: 12.5 g/dL (ref 12.0–15.0)
MCH: 27.1 pg (ref 26.0–34.0)
MCHC: 32.3 g/dL (ref 30.0–36.0)
MCV: 83.8 fL (ref 80.0–100.0)
Platelets: 209 10*3/uL (ref 150–400)
RBC: 4.62 MIL/uL (ref 3.87–5.11)
RDW: 14.8 % (ref 11.5–15.5)
WBC: 7.7 10*3/uL (ref 4.0–10.5)
nRBC: 0 % (ref 0.0–0.2)

## 2020-01-31 LAB — STREP PNEUMONIAE URINARY ANTIGEN: Strep Pneumo Urinary Antigen: NEGATIVE

## 2020-01-31 LAB — HIV ANTIBODY (ROUTINE TESTING W REFLEX): HIV Screen 4th Generation wRfx: NONREACTIVE

## 2020-01-31 LAB — MAGNESIUM: Magnesium: 2.2 mg/dL (ref 1.7–2.4)

## 2020-01-31 MED ORDER — AZITHROMYCIN 250 MG PO TABS: 250.0000 mg | ORAL_TABLET | Freq: Every day | ORAL | Status: DC

## 2020-01-31 MED ORDER — AZITHROMYCIN 250 MG PO TABS: ORAL_TABLET | ORAL | 0 refills | Status: DC

## 2020-01-31 NOTE — Plan of Care (Signed)
  Problem: Education: Goal: Knowledge of General Education information will improve Description: Including pain rating scale, medication(s)/side effects and non-pharmacologic comfort measures Outcome: Completed/Met

## 2020-01-31 NOTE — Discharge Summary (Signed)
Physician Discharge Summary  Dawn Stein ZOX:[id number] DOB: December 24, 1970 DOA: 01/30/2020  PCP: Barbette Reichmann, MD  Admit date: 01/30/2020 Discharge date: 01/31/2020  Admitted From: home Disposition:  home  Recommendations for Outpatient Follow-up:  1. Follow up with PCP in 1 week  Home Health: no  Equipment/Devices:  Discharge Condition: stable CODE STATUS: full  Diet recommendation: Heart Healthy  Brief/Interim Summary: HPI was taken from Dr. Clyde Lundborg: Dawn Stein is a 49 y.o. female with medical history significant of stroke, seizure, hyperlipidemia, depression, closed head injury, tobacco abuse, CKD stage III, who presents with cough, shortness breath.  Patient states that her symptoms have been going on for more than 2 days, including cough with brownish colored sputum production, nasal congestion, mild shortness breath.  Patient also has mild fever and chills.  Denies chest pain.  She also reports some headache.  No nausea, vomiting, diarrhea, abdominal pain, symptoms of UTI or unilateral weakness.  Patient was found to have oxygen desaturation to 88% on room air transiently in ED, which later on improved to 99% on room air.  ED Course: pt was found to have WBC 8.8, negative COVID-19 PCR, potassium 3.4, stable renal function, negative pregnancy test, negative urinalysis, temperature 100.6, blood pressure 104/51, heart rate 89, RR 18, chest x-ray showed bilateral lower lobe multifocal infiltration.  Patient is placed on MedSurg bed for observation  Hospital Course from Dr. Shela Commons. Mayford Knife 01/31/20: Pt was found to have multifocal pneumonia and was started on IV abxs. Pt was evidently initially on supplemental oxygen and weaned off prior to d/c. Pt was d/c home po azithromycin to finish the course at home. For more information please see previous progress notes.   Discharge Diagnoses:  Principal Problem:   Multifocal pneumonia Active Problems:   CVA (cerebral  vascular accident) (HCC)   Seizures (HCC)   HLD (hyperlipidemia)   Depression   Acute respiratory failure with hypoxia (HCC)   Tobacco abuse   Hypokalemia   CKD (chronic kidney disease), stage IIIa  Multifocal pneumonia: negative COVID-19. Dyspnea improved today. Continue on IV rocephin, azithromycin. Continue on bronchodilators & mucinex prn for cough. Legionella, strep pending. Blood cxs pending. Not currently requiring supplemental oxygen.   CVA: continue on aspirin, plavix, statin   Seizure disorder: continue on home dose of lamictal, depakote. IV ativan prn   HLD: continue on statin   Depression: severity unknown. Continue on home dose of venlafaxine   Tobacco abuse: continue on nicotine patch to prevent w/drawal  Hypokalemia: WNL today. Will continue to monitor   CKDIIIa:stable. Avoid nephrotoxic meds  HTN: continue on home dose of amlodipine   Discharge Instructions  Discharge Instructions    Diet - low sodium heart healthy   Complete by: As directed    Discharge instructions   Complete by: As directed    F/u w/ PCP in 1 week   Increase activity slowly   Complete by: As directed      Allergies as of 01/31/2020      Reactions   Tetracyclines & Related Anaphylaxis   Facial swelling      Medication List    TAKE these medications   amLODipine 5 MG tablet Commonly known as: NORVASC Take 5 mg by mouth daily.   aspirin EC 81 MG tablet Take 81 mg by mouth daily.   atorvastatin 10 MG tablet Commonly known as: LIPITOR Take 10 mg by mouth daily.   azithromycin 250 MG tablet Commonly known as: ZITHROMAX  daily x 4 days  Start taking on: February 01, 2020   clopidogrel 75 MG tablet Commonly known as: PLAVIX Take 75 mg by mouth daily.   divalproex 250 MG DR tablet Commonly known as: DEPAKOTE Take 250 mg by mouth daily.   lamoTRIgine 100 MG tablet Commonly known as: LAMICTAL Take 100 mg by mouth daily.   lamoTRIgine 200 MG tablet Commonly  known as: LAMICTAL Take 200 mg by mouth every evening.   venlafaxine XR 75 MG 24 hr capsule Commonly known as: EFFEXOR-XR Take 75 mg by mouth daily.       Follow-up Information    Barbette Reichmann, MD. Schedule an appointment as soon as possible for a visit in 2 days.   Specialty: Internal Medicine Contact information: [address] [place] [address] [phone]              Allergies  Allergen Reactions  . Tetracyclines & Related Anaphylaxis    Facial swelling    Consultations:     Procedures/Studies: DG Chest 2 View  Result Date: 01/30/2020 CLINICAL DATA:  Productive cough EXAM: CHEST - 2 VIEW COMPARISON:  None. FINDINGS: The heart size and mediastinal contours are within normal limits. Patchy airspace opacities are seen within the left lower lung and periphery of the right lung. No pleural effusion is seen. No acute osseous abnormality. IMPRESSION: Multifocal patchy airspace opacities seen within both lower lungs, left greater than right, likely consistent with multifocal pneumonia. Electronically Signed   By: Jonna Clark M.D.   On: 01/30/2020 01:56   CT Head Wo Contrast  Result Date: 01/30/2020 CLINICAL DATA:  Headaches for 2 days EXAM: CT HEAD WITHOUT CONTRAST TECHNIQUE: Contiguous axial images were obtained from the base of the skull through the vertex without intravenous contrast. COMPARISON:  10/25/2016 FINDINGS: Brain: No evidence of acute infarction, hemorrhage, hydrocephalus, extra-axial collection or mass lesion/mass effect. Scattered bilateral infarcts are again identified stable from the prior exam. These include the frontal lobes bilaterally as well as the occipital lobes bilaterally and right cerebellum. Vascular: No hyperdense vessel or unexpected calcification. Skull: Normal. Negative for fracture or focal lesion. Sinuses/Orbits: No acute finding. Other: None. IMPRESSION: Chronic bilateral infarcts.  No acute abnormality  is noted. Electronically Signed   By: Alcide Clever M.D.   On: 01/30/2020 01:47       Subjective: Pt c/o shortness of breath    Discharge Exam: Vitals:   01/31/20 0521 01/31/20 0821  BP: (!) 101/63 123/71  Pulse: 85 69  Resp: 18 16  Temp: 99.8 F (37.7 C) 98.7 F (37.1 C)  SpO2: 95% 98%   Vitals:   01/30/20 2047 01/30/20 2357 01/31/20 0521 01/31/20 0821  BP: (!) 114/55 (!) 113/60 (!) 101/63 123/71  Pulse: 72 86 85 69  Resp: 18 16 18 16   Temp: 98.2 F (36.8 C) 98.5 F (36.9 C) 99.8 F (37.7 C) 98.7 F (37.1 C)  TempSrc: Oral Oral Oral Oral  SpO2: 100% 100% 95% 98%  Weight:      Height:       General exam: Appears calm & lethargic Respiratory system: decreased breath sounds b/l. No rales Cardiovascular system: S1 & S2 +. No rubs, gallops or clicks.  Gastrointestinal system: Abdomen is obese, soft and nontender. Normal bowel sounds heard. Central nervous system: Alert and oriented. Moves all 4 extremities  Psychiatry: Judgement and insight appear normal. Flat mood and affect.      The results of significant diagnostics from this hospitalization (including imaging, microbiology, ancillary  and laboratory) are listed below for reference.     Microbiology: Recent Results (from the past 240 hour(s))  Respiratory Panel by RT PCR (Flu A&B, Covid) - Nasopharyngeal Swab     Status: None   Collection Time: 01/30/20  5:01 AM   Specimen: Nasopharyngeal Swab  Result Value Ref Range Status   SARS Coronavirus 2 by RT PCR NEGATIVE NEGATIVE Final    Comment: (NOTE) SARS-CoV-2 target nucleic acids are NOT DETECTED.  The SARS-CoV-2 RNA is generally detectable in upper respiratoy specimens during the acute phase of infection. The lowest concentration of SARS-CoV-2 viral copies this assay can detect is 131 copies/mL. A negative result does not preclude SARS-Cov-2 infection and should not be used as the sole basis for treatment or other patient management decisions. A negative  result may occur with  improper specimen collection/handling, submission of specimen other than nasopharyngeal swab, presence of viral mutation(s) within the areas targeted by this assay, and inadequate number of viral copies (<131 copies/mL). A negative result must be combined with clinical observations, patient history, and epidemiological information. The expected result is Negative.  Fact Sheet for Patients:  https://www.moore.com/  Fact Sheet for Healthcare Providers:  https://www.young.biz/  This test is no t yet approved or cleared by the [place] FDA and  has been authorized for detection and/or diagnosis of SARS-CoV-2 by FDA under an Emergency Use Authorization (EUA). This EUA will remain  in effect (meaning this test can be used) for the duration of the COVID-19 declaration under Section 564(b)(1) of the Act, 21 U.S.C. section 360bbb-3(b)(1), unless the authorization is terminated or revoked sooner.     Influenza A by PCR NEGATIVE NEGATIVE Final   Influenza B by PCR NEGATIVE NEGATIVE Final    Comment: (NOTE) The Xpert Xpress SARS-CoV-2/FLU/RSV assay is intended as an aid in  the diagnosis of influenza from Nasopharyngeal swab specimens and  should not be used as a sole basis for treatment. Nasal washings and  aspirates are unacceptable for Xpert Xpress SARS-CoV-2/FLU/RSV  testing.  Fact Sheet for Patients: https://www.moore.com/  Fact Sheet for Healthcare Providers: https://www.young.biz/  This test is not yet approved or cleared by the [place] FDA and  has been authorized for detection and/or diagnosis of SARS-CoV-2 by  FDA under an Emergency Use Authorization (EUA). This EUA will remain  in effect (meaning this test can be used) for the duration of the  Covid-19 declaration under Section 564(b)(1) of the Act, 21  U.S.C. section 360bbb-3(b)(1), unless the authorization is   terminated or revoked. Performed at [facility], [address] [place], [place] [address]   CULTURE, BLOOD (ROUTINE X 2) w Reflex to ID Panel     Status: None (Preliminary result)   Collection Time: 01/30/20  9:20 PM   Specimen: BLOOD  Result Value Ref Range Status   Specimen Description BLOOD LEFT AC  Final   Special Requests   Final    BOTTLES DRAWN AEROBIC AND ANAEROBIC Blood Culture adequate volume   Culture   Final    NO GROWTH < 12 HOURS Performed at [facility], [address], [place], [place] [address]    Report Status PENDING  Incomplete  CULTURE, BLOOD (ROUTINE X 2) w Reflex to ID Panel     Status: None (Preliminary result)   Collection Time: 01/30/20  9:20 PM   Specimen: BLOOD  Result Value Ref Range Status   Specimen Description BLOOD RIGHT HAND  Final   Special Requests   Final  BOTTLES DRAWN AEROBIC AND ANAEROBIC Blood Culture adequate volume   Culture   Final    NO GROWTH < 12 HOURS Performed at [facility], [address] [place], [place] [address]    Report Status PENDING  Incomplete     Labs: BNP (last 3 results) No results for input(s): BNP in the last 8760 hours. Basic Metabolic Panel: Recent Labs  Lab 01/30/20 0123 01/31/20 0548  NA 134* 139  K 3.4* 3.9  CL 105 106  CO2 21* 24  GLUCOSE 111* 100*  BUN 13 12  CREATININE 1.17* 1.10*  CALCIUM 7.8* 7.9*  MG  --  2.2   Liver Function Tests: No results for input(s): AST, ALT, ALKPHOS, BILITOT, PROT, ALBUMIN in the last 168 hours. No results for input(s): LIPASE, AMYLASE in the last 168 hours. No results for input(s): AMMONIA in the last 168 hours. CBC: Recent Labs  Lab 01/30/20 0123 01/31/20 0548  WBC 8.8 7.7  HGB 12.2 12.5  HCT 38.0 38.7  MCV 83.7 83.8  PLT 185 209   Cardiac Enzymes: No results for input(s): CKTOTAL, CKMB, CKMBINDEX, TROPONINI in the last 168 hours. BNP: Invalid input(s): POCBNP CBG: No results for input(s): GLUCAP in the last  168 hours. D-Dimer No results for input(s): DDIMER in the last 72 hours. Hgb A1c No results for input(s): HGBA1C in the last 72 hours. Lipid Profile No results for input(s): CHOL, HDL, LDLCALC, TRIG, CHOLHDL, LDLDIRECT in the last 72 hours. Thyroid function studies No results for input(s): TSH, T4TOTAL, T3FREE, THYROIDAB in the last 72 hours.  Invalid input(s): FREET3 Anemia work up No results for input(s): VITAMINB12, FOLATE, FERRITIN, TIBC, IRON, RETICCTPCT in the last 72 hours. Urinalysis    Component Value Date/Time   COLORURINE YELLOW (A) 01/30/2020 0123   APPEARANCEUR HAZY (A) 01/30/2020 0123   APPEARANCEUR Clear 02/01/2014 2340   LABSPEC 1.014 01/30/2020 0123   LABSPEC 1.012 02/01/2014 2340   PHURINE 6.0 01/30/2020 0123   GLUCOSEU NEGATIVE 01/30/2020 0123   GLUCOSEU Negative 02/01/2014 2340   HGBUR NEGATIVE 01/30/2020 0123   BILIRUBINUR NEGATIVE 01/30/2020 0123   BILIRUBINUR Negative 02/01/2014 2340   KETONESUR NEGATIVE 01/30/2020 0123   PROTEINUR NEGATIVE 01/30/2020 0123   NITRITE NEGATIVE 01/30/2020 0123   LEUKOCYTESUR NEGATIVE 01/30/2020 0123   LEUKOCYTESUR Negative 02/01/2014 2340   Sepsis Labs Invalid input(s): PROCALCITONIN,  WBC,  LACTICIDVEN Microbiology Recent Results (from the past 240 hour(s))  Respiratory Panel by RT PCR (Flu A&B, Covid) - Nasopharyngeal Swab     Status: None   Collection Time: 01/30/20  5:01 AM   Specimen: Nasopharyngeal Swab  Result Value Ref Range Status   SARS Coronavirus 2 by RT PCR NEGATIVE NEGATIVE Final    Comment: (NOTE) SARS-CoV-2 target nucleic acids are NOT DETECTED.  The SARS-CoV-2 RNA is generally detectable in upper respiratoy specimens during the acute phase of infection. The lowest concentration of SARS-CoV-2 viral copies this assay can detect is 131 copies/mL. A negative result does not preclude SARS-Cov-2 infection and should not be used as the sole basis for treatment or other patient management decisions. A  negative result may occur with  improper specimen collection/handling, submission of specimen other than nasopharyngeal swab, presence of viral mutation(s) within the areas targeted by this assay, and inadequate number of viral copies (<131 copies/mL). A negative result must be combined with clinical observations, patient history, and epidemiological information. The expected result is Negative.  Fact Sheet for Patients:  https://www.moore.com/https://www.fda.gov/media/142436/download  Fact Sheet for Healthcare Providers:  https://www.young.biz/  This test is no t yet approved or cleared by the [place] and  has been authorized for detection and/or diagnosis of SARS-CoV-2 by FDA under an Emergency Use Authorization (EUA). This EUA will remain  in effect (meaning this test can be used) for the duration of the COVID-19 declaration under Section 564(b)(1) of the Act, 21 U.S.C. section 360bbb-3(b)(1), unless the authorization is terminated or revoked sooner.     Influenza A by PCR NEGATIVE NEGATIVE Final   Influenza B by PCR NEGATIVE NEGATIVE Final    Comment: (NOTE) The Xpert Xpress SARS-CoV-2/FLU/RSV assay is intended as an aid in  the diagnosis of influenza from Nasopharyngeal swab specimens and  should not be used as a sole basis for treatment. Nasal washings and  aspirates are unacceptable for Xpert Xpress SARS-CoV-2/FLU/RSV  testing.  Fact Sheet for Patients: https://www.moore.com/  Fact Sheet for Healthcare Providers: https://www.young.biz/  This test is not yet approved or cleared by the [place] FDA and  has been authorized for detection and/or diagnosis of SARS-CoV-2 by  FDA under an Emergency Use Authorization (EUA). This EUA will remain  in effect (meaning this test can be used) for the duration of the  Covid-19 declaration under Section 564(b)(1) of the Act, 21  U.S.C. section 360bbb-3(b)(1), unless the authorization  is  terminated or revoked. Performed at [facility], [address] [place], [place] [address]   CULTURE, BLOOD (ROUTINE X 2) w Reflex to ID Panel     Status: None (Preliminary result)   Collection Time: 01/30/20  9:20 PM   Specimen: BLOOD  Result Value Ref Range Status   Specimen Description BLOOD LEFT AC  Final   Special Requests   Final    BOTTLES DRAWN AEROBIC AND ANAEROBIC Blood Culture adequate volume   Culture   Final    NO GROWTH < 12 HOURS Performed at [facility], [address] [place], [place] [address]    Report Status PENDING  Incomplete  CULTURE, BLOOD (ROUTINE X 2) w Reflex to ID Panel     Status: None (Preliminary result)   Collection Time: 01/30/20  9:20 PM   Specimen: BLOOD  Result Value Ref Range Status   Specimen Description BLOOD RIGHT HAND  Final   Special Requests   Final    BOTTLES DRAWN AEROBIC AND ANAEROBIC Blood Culture adequate volume   Culture   Final    NO GROWTH < 12 HOURS Performed at [facility], [address] [place], [place] [address]    Report Status PENDING  Incomplete     Time coordinating discharge: Over 30 minutes  SIGNED:   Charise Killian, MD  [facility] 01/31/2020, 4:44 PM Pager   If 7PM-7AM, please contact night-coverage [email or web address]

## 2020-01-31 NOTE — Care Management Obs Status (Signed)
MEDICARE OBSERVATION STATUS NOTIFICATION   Patient Details  Name: Dawn Stein MRN: [id number] Date of Birth: [birth year]   Medicare Observation Status Notification Given:  Other (see comment) (discussed py phone)    Maud Deed, LCSW 01/31/2020, 4:39 PM

## 2020-01-31 NOTE — Progress Notes (Signed)
PROGRESS NOTE    Dawn Stein  OEV:[id number] DOB: 1970-08-06 DOA: 01/30/2020 PCP: Barbette Reichmann, MD    Assessment & Plan:   Principal Problem:   Multifocal pneumonia Active Problems:   CVA (cerebral vascular accident) (HCC)   Seizures (HCC)   HLD (hyperlipidemia)   Depression   Acute respiratory failure with hypoxia (HCC)   Tobacco abuse   Hypokalemia   CKD (chronic kidney disease), stage IIIa   Multifocal pneumonia: negative COVID-19. Dyspnea improved today. Continue on IV rocephin, azithromycin. Continue on bronchodilators & mucinex prn for cough. Legionella, strep pending. Blood cxs pending. Not currently requiring supplemental oxygen.   CVA: continue on aspirin, plavix, statin   Seizure disorder: continue on home dose of lamictal, depakote. IV ativan prn   HLD: continue on statin   Depression: severity unknown. Continue on home dose of venlafaxine   Tobacco abuse: continue on nicotine patch to prevent w/drawal  Hypokalemia: WNL today. Will continue to monitor   CKDIIIa: stable. Avoid nephrotoxic meds  HTN: continue on home dose of amlodipine    DVT prophylaxis: lovenox Code Status: full  Family Communication:  Disposition Plan: depends on PT/OT recs    Consultants:      Procedures:    Antimicrobials: azithromycin, ceftriaxone    Subjective: Pt c/o shortness of breath   Objective: Vitals:   01/30/20 1700 01/30/20 2047 01/30/20 2357 01/31/20 0521  BP: (!) 97/58 (!) 114/55 (!) 113/60 (!) 101/63  Pulse: 68 72 86 85  Resp: 19 18 16 18   Temp:  98.2 F (36.8 C) 98.5 F (36.9 C) 99.8 F (37.7 C)  TempSrc:  Oral Oral Oral  SpO2: 98% 100% 100% 95%  Weight:      Height:        Intake/Output Summary (Last 24 hours) at 01/31/2020 0801 Last data filed at 01/31/2020 0545 Gross per 24 hour  Intake 240 ml  Output --  Net 240 ml   Filed Weights   01/30/20 0107  Weight: (!) 113.4 kg    Examination:  General exam: Appears calm  & lethargic Respiratory system: decreased breath sounds b/l. No rales Cardiovascular system: S1 & S2 +. No rubs, gallops or clicks.  Gastrointestinal system: Abdomen is obese, soft and nontender. Normal bowel sounds heard. Central nervous system: Alert and oriented. Moves all 4 extremities  Psychiatry: Judgement and insight appear normal. Flat mood and affect.     Data Reviewed: I have personally reviewed following labs and imaging studies  CBC: Recent Labs  Lab 01/30/20 0123 01/31/20 0548  WBC 8.8 7.7  HGB 12.2 12.5  HCT 38.0 38.7  MCV 83.7 83.8  PLT 185 209   Basic Metabolic Panel: Recent Labs  Lab 01/30/20 0123 01/31/20 0548  NA 134* 139  K 3.4* 3.9  CL 105 106  CO2 21* 24  GLUCOSE 111* 100*  BUN 13 12  CREATININE 1.17* 1.10*  CALCIUM 7.8* 7.9*  MG  --  2.2   GFR: Estimated Creatinine Clearance: 74.4 mL/min (A) (by C-G formula based on SCr of 1.1 mg/dL (H)). Liver Function Tests: No results for input(s): AST, ALT, ALKPHOS, BILITOT, PROT, ALBUMIN in the last 168 hours. No results for input(s): LIPASE, AMYLASE in the last 168 hours. No results for input(s): AMMONIA in the last 168 hours. Coagulation Profile: No results for input(s): INR, PROTIME in the last 168 hours. Cardiac Enzymes: No results for input(s): CKTOTAL, CKMB, CKMBINDEX, TROPONINI in the last 168 hours. BNP (last 3 results) No results for input(s):  PROBNP in the last 8760 hours. HbA1C: No results for input(s): HGBA1C in the last 72 hours. CBG: No results for input(s): GLUCAP in the last 168 hours. Lipid Profile: No results for input(s): CHOL, HDL, LDLCALC, TRIG, CHOLHDL, LDLDIRECT in the last 72 hours. Thyroid Function Tests: No results for input(s): TSH, T4TOTAL, FREET4, T3FREE, THYROIDAB in the last 72 hours. Anemia Panel: No results for input(s): VITAMINB12, FOLATE, FERRITIN, TIBC, IRON, RETICCTPCT in the last 72 hours. Sepsis Labs: No results for input(s): PROCALCITON, LATICACIDVEN in the  last 168 hours.  Recent Results (from the past 240 hour(s))  Respiratory Panel by RT PCR (Flu A&B, Covid) - Nasopharyngeal Swab     Status: None   Collection Time: 01/30/20  5:01 AM   Specimen: Nasopharyngeal Swab  Result Value Ref Range Status   SARS Coronavirus 2 by RT PCR NEGATIVE NEGATIVE Final    Comment: (NOTE) SARS-CoV-2 target nucleic acids are NOT DETECTED.  The SARS-CoV-2 RNA is generally detectable in upper respiratoy specimens during the acute phase of infection. The lowest concentration of SARS-CoV-2 viral copies this assay can detect is 131 copies/mL. A negative result does not preclude SARS-Cov-2 infection and should not be used as the sole basis for treatment or other patient management decisions. A negative result may occur with  improper specimen collection/handling, submission of specimen other than nasopharyngeal swab, presence of viral mutation(s) within the areas targeted by this assay, and inadequate number of viral copies (<131 copies/mL). A negative result must be combined with clinical observations, patient history, and epidemiological information. The expected result is Negative.  Fact Sheet for Patients:  https://www.moore.com/  Fact Sheet for Healthcare Providers:  https://www.young.biz/  This test is no t yet approved or cleared by the [place] FDA and  has been authorized for detection and/or diagnosis of SARS-CoV-2 by FDA under an Emergency Use Authorization (EUA). This EUA will remain  in effect (meaning this test can be used) for the duration of the COVID-19 declaration under Section 564(b)(1) of the Act, 21 U.S.C. section 360bbb-3(b)(1), unless the authorization is terminated or revoked sooner.     Influenza A by PCR NEGATIVE NEGATIVE Final   Influenza B by PCR NEGATIVE NEGATIVE Final    Comment: (NOTE) The Xpert Xpress SARS-CoV-2/FLU/RSV assay is intended as an aid in  the diagnosis of influenza  from Nasopharyngeal swab specimens and  should not be used as a sole basis for treatment. Nasal washings and  aspirates are unacceptable for Xpert Xpress SARS-CoV-2/FLU/RSV  testing.  Fact Sheet for Patients: https://www.moore.com/  Fact Sheet for Healthcare Providers: https://www.young.biz/  This test is not yet approved or cleared by the [place] FDA and  has been authorized for detection and/or diagnosis of SARS-CoV-2 by  FDA under an Emergency Use Authorization (EUA). This EUA will remain  in effect (meaning this test can be used) for the duration of the  Covid-19 declaration under Section 564(b)(1) of the Act, 21  U.S.C. section 360bbb-3(b)(1), unless the authorization is  terminated or revoked. Performed at [facility], [address] [place], [place] [address]   CULTURE, BLOOD (ROUTINE X 2) w Reflex to ID Panel     Status: None (Preliminary result)   Collection Time: 01/30/20  9:20 PM   Specimen: BLOOD  Result Value Ref Range Status   Specimen Description BLOOD LEFT [facility]  Final   Special Requests   Final    BOTTLES DRAWN AEROBIC AND ANAEROBIC Blood Culture adequate volume   Culture   Final  NO GROWTH < 12 HOURS Performed at [facility], [address] [place], [place] [address]    Report Status PENDING  Incomplete  CULTURE, BLOOD (ROUTINE X 2) w Reflex to ID Panel     Status: None (Preliminary result)   Collection Time: 01/30/20  9:20 PM   Specimen: BLOOD  Result Value Ref Range Status   Specimen Description BLOOD RIGHT HAND  Final   Special Requests   Final    BOTTLES DRAWN AEROBIC AND ANAEROBIC Blood Culture adequate volume   Culture   Final    NO GROWTH < 12 HOURS Performed at [facility], [address], [place], [place] [address]    Report Status PENDING  Incomplete         Radiology Studies: DG Chest 2 View  Result Date: 01/30/2020 CLINICAL DATA:  Productive cough EXAM:  CHEST - 2 VIEW COMPARISON:  None. FINDINGS: The heart size and mediastinal contours are within normal limits. Patchy airspace opacities are seen within the left lower lung and periphery of the right lung. No pleural effusion is seen. No acute osseous abnormality. IMPRESSION: Multifocal patchy airspace opacities seen within both lower lungs, left greater than right, likely consistent with multifocal pneumonia. Electronically Signed   By: Jonna Clark M.D.   On: 01/30/2020 01:56   CT Head Wo Contrast  Result Date: 01/30/2020 CLINICAL DATA:  Headaches for 2 days EXAM: CT HEAD WITHOUT CONTRAST TECHNIQUE: Contiguous axial images were obtained from the base of the skull through the vertex without intravenous contrast. COMPARISON:  10/25/2016 FINDINGS: Brain: No evidence of acute infarction, hemorrhage, hydrocephalus, extra-axial collection or mass lesion/mass effect. Scattered bilateral infarcts are again identified stable from the prior exam. These include the frontal lobes bilaterally as well as the occipital lobes bilaterally and right cerebellum. Vascular: No hyperdense vessel or unexpected calcification. Skull: Normal. Negative for fracture or focal lesion. Sinuses/Orbits: No acute finding. Other: None. IMPRESSION: Chronic bilateral infarcts.  No acute abnormality is noted. Electronically Signed   By: Alcide Clever M.D.   On: 01/30/2020 01:47        Scheduled Meds: . amLODipine  5 mg Oral Daily  . aspirin EC  81 mg Oral Daily  . atorvastatin  10 mg Oral Daily  . azithromycin  500 mg Oral Once  . clopidogrel  75 mg Oral Daily  . divalproex  250 mg Oral Daily  . enoxaparin (LOVENOX) injection  40 mg Subcutaneous Q12H  . lamoTRIgine  100 mg Oral q AM  . lamoTRIgine  200 mg Oral QPM  . nicotine  21 mg Transdermal Daily  . venlafaxine XR  75 mg Oral Daily   Continuous Infusions: . cefTRIAXone (ROCEPHIN)  IV       LOS: 0 days    Time spent: 32 mins    Charise Killian, MD [facility] Pager [phone]  If 7PM-7AM, please contact night-coverage 01/31/2020, 8:01 AM

## 2020-02-02 LAB — LEGIONELLA PNEUMOPHILA SEROGP 1 UR AG: L. pneumophila Serogp 1 Ur Ag: NEGATIVE

## 2020-02-04 LAB — CULTURE, BLOOD (ROUTINE X 2)
Culture: NO GROWTH
Culture: NO GROWTH
Special Requests: ADEQUATE
Special Requests: ADEQUATE

## 2020-04-08 ENCOUNTER — Emergency Department: Payer: Medicare Other

## 2020-04-08 ENCOUNTER — Emergency Department
Admission: EM | Admit: 2020-04-08 | Discharge: 2020-04-08 | Disposition: A | Discharge status: Home / Self Care | Payer: Medicare Other | Attending: Emergency Medicine | Admitting: Emergency Medicine

## 2020-04-08 ENCOUNTER — Other Ambulatory Visit: Payer: Self-pay

## 2020-04-08 DIAGNOSIS — R630 Anorexia: Secondary | ICD-10-CM | POA: Diagnosis present

## 2020-04-08 DIAGNOSIS — Z8673 Personal history of transient ischemic attack (TIA), and cerebral infarction without residual deficits: Secondary | ICD-10-CM | POA: Diagnosis not present

## 2020-04-08 DIAGNOSIS — Z7982 Long term (current) use of aspirin: Secondary | ICD-10-CM | POA: Diagnosis not present

## 2020-04-08 DIAGNOSIS — F17200 Nicotine dependence, unspecified, uncomplicated: Secondary | ICD-10-CM | POA: Diagnosis not present

## 2020-04-08 DIAGNOSIS — Z79899 Other long term (current) drug therapy: Secondary | ICD-10-CM | POA: Insufficient documentation

## 2020-04-08 DIAGNOSIS — R5383 Other fatigue: Secondary | ICD-10-CM | POA: Diagnosis not present

## 2020-04-08 DIAGNOSIS — R519 Headache, unspecified: Secondary | ICD-10-CM | POA: Insufficient documentation

## 2020-04-08 DIAGNOSIS — N183 Chronic kidney disease, stage 3 unspecified: Secondary | ICD-10-CM | POA: Diagnosis not present

## 2020-04-08 DIAGNOSIS — Y92009 Unspecified place in unspecified non-institutional (private) residence as the place of occurrence of the external cause: Secondary | ICD-10-CM

## 2020-04-08 DIAGNOSIS — W19XXXA Unspecified fall, initial encounter: Secondary | ICD-10-CM

## 2020-04-08 LAB — URINALYSIS, COMPLETE (UACMP) WITH MICROSCOPIC
Bacteria, UA: NONE SEEN
Bilirubin Urine: NEGATIVE
Glucose, UA: NEGATIVE mg/dL
Hgb urine dipstick: NEGATIVE
Ketones, ur: 5 mg/dL — AB
Leukocytes,Ua: NEGATIVE
Nitrite: NEGATIVE
Protein, ur: NEGATIVE mg/dL
Specific Gravity, Urine: 1.016 (ref 1.005–1.030)
pH: 7 (ref 5.0–8.0)

## 2020-04-08 LAB — CBC
HCT: 42.5 % (ref 36.0–46.0)
Hemoglobin: 14 g/dL (ref 12.0–15.0)
MCH: 27.3 pg (ref 26.0–34.0)
MCHC: 32.9 g/dL (ref 30.0–36.0)
MCV: 83 fL (ref 80.0–100.0)
Platelets: 189 10*3/uL (ref 150–400)
RBC: 5.12 MIL/uL — ABNORMAL HIGH (ref 3.87–5.11)
RDW: 16.3 % — ABNORMAL HIGH (ref 11.5–15.5)
WBC: 4.9 10*3/uL (ref 4.0–10.5)
nRBC: 0 % (ref 0.0–0.2)

## 2020-04-08 LAB — BASIC METABOLIC PANEL
Anion gap: 8 (ref 5–15)
BUN: 14 mg/dL (ref 6–20)
CO2: 26 mmol/L (ref 22–32)
Calcium: 8.1 mg/dL — ABNORMAL LOW (ref 8.9–10.3)
Chloride: 106 mmol/L (ref 98–111)
Creatinine, Ser: 1.19 mg/dL — ABNORMAL HIGH (ref 0.44–1.00)
GFR, Estimated: 54 mL/min — ABNORMAL LOW (ref >60)
Glucose, Bld: 106 mg/dL — ABNORMAL HIGH (ref 70–99)
Potassium: 3.9 mmol/L (ref 3.5–5.1)
Sodium: 140 mmol/L (ref 135–145)

## 2020-04-08 NOTE — ED Triage Notes (Signed)
Says she is worried about having a stroke last night.  Brought from [facility].  Says she notices last eve when she went to refrigerator that it was moving.  Saw this with both eyes.  She has shaking movements with hands at times and then just stops shaking.  She has no facial droop, no arm drift.

## 2020-04-08 NOTE — ED Provider Notes (Signed)
[facility] Emergency Department Provider Note  ____________________________________________  Time seen: Approximately 2:30 PM  I have reviewed the triage vital signs and the nursing notes.   HISTORY  Chief Complaint Fall    HPI Dawn Stein is a 49 y.o. female with a past history of seizure and strokes who was in her usual state of health last night, ate dinner, smoked marijuana at 7:30 PM as per her usual routine, and went to bed later.  She woke up at 1:00 AM feeling hot.  She walked to the kitchen to get water, and felt like the fridge was swaying around forward and backward and side to side.  She was unable to put the water pitcher back into the fridge due to the motion.  She then tried to turn away from the fridge and felt like the stove was moving, causing her to fall.   She crawled back to bed, noting that she fell asleep multiple times on the way.  No preceding symptoms, felt normal at bedtime.  She feels back to normal now.  No weight changes, no sick contacts, no fevers or body aches.  No chest pain or shortness of breath.  Patient notes that she usually does not eat until about 7 PM at night after waking up at 10 AM due to a lack of appetite.  This is a longstanding pattern for her.   Past Medical History:  Diagnosis Date  . Closed head injury    at age 24  . Seizures (HCC)   . Stroke [facility])      Patient Active Problem List   Diagnosis Date Noted  . Seizures (HCC)   . HLD (hyperlipidemia)   . Depression   . Acute respiratory failure with hypoxia (HCC)   . Tobacco abuse   . Multifocal pneumonia   . Hypokalemia   . CKD (chronic kidney disease), stage IIIa   . TIA (transient ischemic attack) 10/26/2016  . CVA (cerebral vascular accident) (HCC) 10/26/2016  . Slurred speech 04/08/2015     Past Surgical History:  Procedure Laterality Date  . none       Prior to Admission medications   Medication Sig Start Date End Date  Taking? Authorizing Provider  amLODipine (NORVASC) 5 MG tablet Take 5 mg by mouth daily. 12/17/19   [provider]  aspirin EC 81 MG tablet Take 81 mg by mouth daily.    [provider]  atorvastatin (LIPITOR) 10 MG tablet Take 10 mg by mouth daily.    [provider]  azithromycin (ZITHROMAX) 250 MG tablet 250mg  daily x 4 days 02/01/20   04/02/20, MD  clopidogrel (PLAVIX) 75 MG tablet Take 75 mg by mouth daily. 10/30/19   [provider]  divalproex (DEPAKOTE) 250 MG DR tablet Take 250 mg by mouth daily.     [provider]  lamoTRIgine (LAMICTAL) 100 MG tablet Take 100 mg by mouth daily. 12/17/19   [provider]  lamoTRIgine (LAMICTAL) 200 MG tablet Take 200 mg by mouth every evening. 12/31/19   [provider]  venlafaxine XR (EFFEXOR-XR) 75 MG 24 hr capsule Take 75 mg by mouth daily. 01/27/20   [provider]     Allergies Tetracyclines & related   Family History  Problem Relation Age of Onset  . Seizures Mother   . Lung cancer Maternal Grandfather   . Breast cancer Maternal Aunt     Social History Social History   Tobacco Use  .  Smoking status: Current Every Day Smoker  . Smokeless tobacco: Never Used  Substance Use Topics  . Alcohol use: No  . Drug use: No    Review of Systems  Constitutional:   No fever or chills.  ENT:   No sore throat. No rhinorrhea. Cardiovascular:   No chest pain or syncope. Respiratory:   No dyspnea or cough. Gastrointestinal:   Negative for abdominal pain, vomiting and diarrhea.  Musculoskeletal:   Negative for focal pain or swelling All other systems reviewed and are negative except as documented above in ROS and HPI.  ____________________________________________   PHYSICAL EXAM:  VITAL SIGNS: ED Triage Vitals  Enc Vitals Group     BP 04/08/20 1247 (!) 130/59     Pulse Rate 04/08/20 1247 65     Resp 04/08/20 1247 18     Temp 04/08/20 1247 98.4 F (36.9  C)     Temp Source 04/08/20 1247 Oral     SpO2 04/08/20 1247 100 %     Weight 04/08/20 1249 255 lb (115.7 kg)     Height 04/08/20 1249 5\' 3"  (1.6 m)     Head Circumference --      Peak Flow --      Pain Score 04/08/20 1249 0     Pain Loc --      Pain Edu? --      Excl. in GC? --     Vital signs reviewed, nursing assessments reviewed.   Constitutional:   Alert and oriented. Non-toxic appearance. Eyes:   Conjunctivae are normal. EOMI. PERRL.  No nystagmus ENT      Head:   Normocephalic and atraumatic.  No battle sign or raccoon eyes      Nose:   Normal.  No epistaxis      Mouth/Throat:   Moist mucous membranes, no intraoral injury      Neck:   No meningismus. Full ROM. Hematological/Lymphatic/Immunilogical:   No cervical lymphadenopathy. Cardiovascular:   RRR. Symmetric bilateral radial and DP pulses.  No murmurs. Cap refill less than 2 seconds. Respiratory:   Normal respiratory effort without tachypnea/retractions. Breath sounds are clear and equal bilaterally. No wheezes/rales/rhonchi. Gastrointestinal:   Soft and nontender. Non distended. There is no CVA tenderness.  No rebound, rigidity, or guarding.  Musculoskeletal:   Normal range of motion in all extremities. No joint effusions.  No lower extremity tenderness.  No edema. Neurologic:   Normal speech and language.  Cranial nerves III through XII intact Motor grossly intact.  No drift No acute focal neurologic deficits are appreciated.  Skin:    Skin is warm, dry and intact. No rash noted.  No petechiae, purpura, or bullae.  ____________________________________________    LABS (pertinent positives/negatives) (all labs ordered are listed, but only abnormal results are displayed) Labs Reviewed  BASIC METABOLIC PANEL - Abnormal; Notable for the following components:      Result Value   Glucose, Bld 106 (*)    Creatinine, Ser 1.19 (*)    Calcium 8.1 (*)    GFR, Estimated 54 (*)    All other components within normal  limits  CBC - Abnormal; Notable for the following components:   RBC 5.12 (*)    RDW 16.3 (*)    All other components within normal limits  URINALYSIS, COMPLETE (UACMP) WITH MICROSCOPIC - Abnormal; Notable for the following components:   Color, Urine YELLOW (*)    APPearance HAZY (*)    Ketones, ur 5 (*)    All  other components within normal limits  CBG MONITORING, ED   ____________________________________________   EKG  Interpreted by me Sinus rhythm rate of 66, normal axis and intervals.  Normal QRS ST segments and T waves.  1 PVC on the strip.  ____________________________________________    RADIOLOGY  CT Head Wo Contrast  Result Date: 04/08/2020 CLINICAL DATA:  Dizziness EXAM: CT HEAD WITHOUT CONTRAST TECHNIQUE: Contiguous axial images were obtained from the base of the skull through the vertex without intravenous contrast. COMPARISON:  CT 01/30/2020 FINDINGS: Brain: Redemonstrated regions of encephalomalacia in the bilateral frontal lobes, right parieto-occipital region and left occipital lobe and in the bilateral cerebellar hemispheres. These are not significantly changed from prior. No new CT evident areas of acute infarct are identified. No evidence of acute hemorrhage, hydrocephalus, extra-axial collection, visible mass lesion or mass effect. Vascular: Atherosclerotic calcification of the carotid siphons and intradural vertebral arteries. No hyperdense vessel. Skull: No calvarial fracture or suspicious osseous lesion. No scalp swelling or hematoma. Sinuses/Orbits: Paranasal sinuses and mastoid air cells are predominantly clear. Included orbital structures are unremarkable. Other: None IMPRESSION: 1. Stable regions of encephalomalacia in the bilateral frontal lobes, right parieto-occipital region and left occipital lobe and in the bilateral cerebellar hemispheres. If there is concern for acute infarction or expansion of the previously infarcted territories, MRI with diffusion  imaging would be more sensitive and specific. 2. No other acute intracranial abnormality. Electronically Signed   By: Kreg ShropshirePrice  DeHay M.D.   On: 04/08/2020 15:33    ____________________________________________   PROCEDURES Procedures  ____________________________________________  DIFFERENTIAL DIAGNOSIS   Fatigue, parasomnia/somnolence, dehydration, traumatic subdural hematoma, peripheral vertigo  CLINICAL IMPRESSION / ASSESSMENT AND PLAN / ED COURSE  Medications ordered in the ED: Medications - No data to display  Pertinent labs & imaging results that were available during my care of the patient were reviewed by me and considered in my medical decision making (see chart for details).  Jerolyn CenterLathosua Misty StanleyVeronica Folden was evaluated in Emergency Department on 04/08/2020 for the symptoms described in the history of present illness. She was evaluated in the context of the global COVID-19 pandemic, which necessitated consideration that the patient might be at risk for infection with the SARS-CoV-2 virus that causes COVID-19. Institutional protocols and algorithms that pertain to the evaluation of patients at risk for COVID-19 are in a state of rapid change based on information released by regulatory bodies including the CDC and federal and state organizations. These policies and algorithms were followed during the patient's care in the ED.   Patient presents after a fall last night.  The history as she describes it is suspicious for vertigo versus unsteadiness on her feet and presyncope versus somnolence while standing at the fridge.  No worrisome red flag symptoms, asymptomatic now.  She is tolerating oral intake.  Vital signs unremarkable and exam is nonfocal.  Labs are normal.  Will obtain CT head to rule out intracranial hemorrhage from the fall after which she can be discharged to follow-up with her PCP Dr. Marcello FennelHande this week.  ----------------------------------------- 3:39 PM on  04/08/2020 -----------------------------------------  CT head unremarkable.  Stable for outpatient follow-up.     ____________________________________________   FINAL CLINICAL IMPRESSION(S) / ED DIAGNOSES    Final diagnoses:  Fall in home, initial encounter  Fatigue, unspecified type     ED Discharge Orders    None      Portions of this note were generated with dragon dictation software. Dictation errors may occur despite best attempts at proofreading.  Sharman Cheek, MD 04/08/20 1539

## 2020-04-08 NOTE — ED Notes (Signed)
See triage note, pt reports multiple falls last night.  Denies hitting head with falls.  States she "kept falling asleep" while trying to get up.  Pt disoriented to month.  Clear speech. Equal grip and strength in all extremities. No obvious deformities.

## 2020-04-08 NOTE — ED Triage Notes (Addendum)
Pt was at fridge last night and felt dizzy with vision changes and fell onto floor at about 0100. Denies hitting head. Denies losing consciousness. Lost urinary control, did not lose bowel control. Pt crawled from kitchen to living room and fell asleep several times while making her way to bedroom. "Could not feel legs". Pulled self up on chair to pull up to bed. Pt states has had 5 or 6 strokes that were seen on imaging; last stroke was about 2005. Pt has equal grip strength with no arm drift or sensory loss at this time.

## 2020-05-23 ENCOUNTER — Other Ambulatory Visit: Payer: Self-pay | Admitting: Internal Medicine

## 2020-05-23 DIAGNOSIS — N6489 Other specified disorders of breast: Secondary | ICD-10-CM

## 2020-09-09 ENCOUNTER — Other Ambulatory Visit: Payer: Self-pay | Admitting: Internal Medicine

## 2020-09-16 ENCOUNTER — Other Ambulatory Visit: Payer: Self-pay | Admitting: Internal Medicine

## 2020-09-16 DIAGNOSIS — N6489 Other specified disorders of breast: Secondary | ICD-10-CM

## 2020-10-19 ENCOUNTER — Ambulatory Visit
Admission: RE | Admit: 2020-10-19 | Discharge: 2020-10-19 | Disposition: A | Discharge status: Home / Self Care | Payer: Medicare Other | Source: Ambulatory Visit | Attending: Internal Medicine | Admitting: Internal Medicine

## 2020-10-19 ENCOUNTER — Other Ambulatory Visit: Payer: Self-pay

## 2020-10-19 DIAGNOSIS — N6489 Other specified disorders of breast: Secondary | ICD-10-CM

## 2021-02-04 ENCOUNTER — Other Ambulatory Visit: Payer: Self-pay

## 2021-02-04 ENCOUNTER — Encounter: Payer: Self-pay | Admitting: Emergency Medicine

## 2021-02-04 ENCOUNTER — Emergency Department
Admission: EM | Admit: 2021-02-04 | Discharge: 2021-02-05 | Disposition: A | Discharge status: Home / Self Care | Payer: Medicare Other | Attending: Emergency Medicine | Admitting: Emergency Medicine

## 2021-02-04 DIAGNOSIS — R251 Tremor, unspecified: Secondary | ICD-10-CM | POA: Diagnosis not present

## 2021-02-04 DIAGNOSIS — Z7982 Long term (current) use of aspirin: Secondary | ICD-10-CM | POA: Diagnosis not present

## 2021-02-04 DIAGNOSIS — R404 Transient alteration of awareness: Secondary | ICD-10-CM | POA: Insufficient documentation

## 2021-02-04 DIAGNOSIS — Y900 Blood alcohol level of less than 20 mg/100 ml: Secondary | ICD-10-CM | POA: Insufficient documentation

## 2021-02-04 DIAGNOSIS — Z79899 Other long term (current) drug therapy: Secondary | ICD-10-CM | POA: Insufficient documentation

## 2021-02-04 DIAGNOSIS — Z8673 Personal history of transient ischemic attack (TIA), and cerebral infarction without residual deficits: Secondary | ICD-10-CM | POA: Diagnosis not present

## 2021-02-04 DIAGNOSIS — F17200 Nicotine dependence, unspecified, uncomplicated: Secondary | ICD-10-CM | POA: Insufficient documentation

## 2021-02-04 DIAGNOSIS — R0682 Tachypnea, not elsewhere classified: Secondary | ICD-10-CM | POA: Diagnosis not present

## 2021-02-04 DIAGNOSIS — Z7902 Long term (current) use of antithrombotics/antiplatelets: Secondary | ICD-10-CM | POA: Diagnosis not present

## 2021-02-04 DIAGNOSIS — R4781 Slurred speech: Secondary | ICD-10-CM | POA: Insufficient documentation

## 2021-02-04 NOTE — ED Triage Notes (Signed)
Pt with noted L arm weakness on neuro assessment. Pt also continues to hyperventilate however is alert and can follow instructions given by staff.

## 2021-02-04 NOTE — ED Triage Notes (Addendum)
Pt to ED via POV with son, pt's son states that patient was in the bathroom on the toilet and she started yelling that she needed help and was unable to get off the toilet. Pt's son states significant history of stroke and seizures. Pt's son reports pt has been shaking and unable to speak. Upon arrival pt noted to be shaking, crying, and stuttering and hyperventilating on arrival. Pt states while she was on the toilet she had some numbness to her arm that has mostly resolved, still complains on some numbness to L hand.

## 2021-02-05 ENCOUNTER — Emergency Department: Payer: Medicare Other

## 2021-02-05 LAB — COMPREHENSIVE METABOLIC PANEL
ALT: 11 U/L (ref 0–44)
AST: 16 U/L (ref 15–41)
Albumin: 3.7 g/dL (ref 3.5–5.0)
Alkaline Phosphatase: 75 U/L (ref 38–126)
Anion gap: 9 (ref 5–15)
BUN: 15 mg/dL (ref 6–20)
CO2: 25 mmol/L (ref 22–32)
Calcium: 8.2 mg/dL — ABNORMAL LOW (ref 8.9–10.3)
Chloride: 105 mmol/L (ref 98–111)
Creatinine, Ser: 1.38 mg/dL — ABNORMAL HIGH (ref 0.44–1.00)
GFR, Estimated: 47 mL/min — ABNORMAL LOW (ref >60)
Glucose, Bld: 74 mg/dL (ref 70–99)
Potassium: 3.6 mmol/L (ref 3.5–5.1)
Sodium: 139 mmol/L (ref 135–145)
Total Bilirubin: 0.5 mg/dL (ref 0.3–1.2)
Total Protein: 7.1 g/dL (ref 6.5–8.1)

## 2021-02-05 LAB — CBC WITH DIFFERENTIAL/PLATELET
Abs Immature Granulocytes: 0.03 10*3/uL (ref 0.00–0.07)
Basophils Absolute: 0.1 10*3/uL (ref 0.0–0.1)
Basophils Relative: 1 %
Eosinophils Absolute: 0.1 10*3/uL (ref 0.0–0.5)
Eosinophils Relative: 1 %
HCT: 42.8 % (ref 36.0–46.0)
Hemoglobin: 14.3 g/dL (ref 12.0–15.0)
Immature Granulocytes: 1 %
Lymphocytes Relative: 18 %
Lymphs Abs: 1.1 10*3/uL (ref 0.7–4.0)
MCH: 28.3 pg (ref 26.0–34.0)
MCHC: 33.4 g/dL (ref 30.0–36.0)
MCV: 84.6 fL (ref 80.0–100.0)
Monocytes Absolute: 0.4 10*3/uL (ref 0.1–1.0)
Monocytes Relative: 6 %
Neutro Abs: 4.5 10*3/uL (ref 1.7–7.7)
Neutrophils Relative %: 73 %
Platelets: 210 10*3/uL (ref 150–400)
RBC: 5.06 MIL/uL (ref 3.87–5.11)
RDW: 16.1 % — ABNORMAL HIGH (ref 11.5–15.5)
WBC: 6.1 10*3/uL (ref 4.0–10.5)
nRBC: 0 % (ref 0.0–0.2)

## 2021-02-05 LAB — ETHANOL: Alcohol, Ethyl (B): <10 mg/dL (ref <10)

## 2021-02-05 LAB — LIPASE, BLOOD: Lipase: 31 U/L (ref 11–51)

## 2021-02-05 LAB — MAGNESIUM: Magnesium: 2.2 mg/dL (ref 1.7–2.4)

## 2021-02-05 LAB — VALPROIC ACID LEVEL: Valproic Acid Lvl: 37 ug/mL — ABNORMAL LOW (ref 50.0–100.0)

## 2021-02-05 MED ORDER — LORAZEPAM 2 MG PO TABS
2.0000 mg | ORAL_TABLET | Freq: Once | ORAL | Status: AC
  Administered 2021-02-05: 2 mg via ORAL
  Filled 2021-02-05: qty 1

## 2021-02-05 NOTE — ED Provider Notes (Signed)
[facility] Emergency Department Provider Note  ____________________________________________   Event Date/Time   First MD Initiated Contact with Patient 02/04/21 2357     (approximate)  I have reviewed the triage vital signs and the nursing notes.   HISTORY  Chief Complaint Altered Mental Status  Level 5 caveat:  history/ROS limited by acute/critical illness  HPI Dawn Stein is a 50 y.o. female with medical history as listed below which notably includes prior history of TIAs and CVAs as well as a listed diagnosis of "slurred speech" from 2016.  She also reportedly has a seizure disorder.  She presents by private vehicle assisted by her son for an altered mental status episode.  History is somewhat difficult to obtain, and the patient currently is not able to provide much in the way of the history, but the son reports that another family member alerted him that his mother needed help in the bathroom.  When he arrived to the bathroom the patient was breathing quickly and was shaking and having stuttering speech, breathing rapidly, very upset, crying.  He insisted she come to the emergency department and she kept saying even after she arrived here that she does not want to be here.  However she does seem confused.  She is denying pain.  She is not able to provide much in the way of review of systems at this time.     Past Medical History:  Diagnosis Date   Closed head injury    at age 50   Seizures (HCC)    Stroke [facility](HCC)     Patient Active Problem List   Diagnosis Date Noted   Seizures (HCC)    HLD (hyperlipidemia)    Depression    Acute respiratory failure with hypoxia (HCC)    Tobacco abuse    Multifocal pneumonia    Hypokalemia    CKD (chronic kidney disease), stage IIIa    TIA (transient ischemic attack) 10/26/2016   CVA (cerebral vascular accident) (HCC) 10/26/2016   Slurred speech 04/08/2015    Past Surgical History:  Procedure  Laterality Date   none      Prior to Admission medications   Medication Sig Start Date End Date Taking? Authorizing Provider  amLODipine (NORVASC) 5 MG tablet Take 5 mg by mouth daily. 12/17/19   [provider]  aspirin EC 81 MG tablet Take 81 mg by mouth daily.    [provider]  atorvastatin (LIPITOR) 10 MG tablet Take 10 mg by mouth daily.    [provider]  azithromycin (ZITHROMAX) 250 MG tablet 250mg  daily x 4 days 02/01/20   Charise KillianWilliams, Jamiese M, MD  clopidogrel (PLAVIX) 75 MG tablet Take 75 mg by mouth daily. 10/30/19   [provider]  divalproex (DEPAKOTE) 250 MG DR tablet Take 250 mg by mouth daily.     [provider]  lamoTRIgine (LAMICTAL) 100 MG tablet Take 100 mg by mouth daily. 12/17/19   [provider]  lamoTRIgine (LAMICTAL) 200 MG tablet Take 200 mg by mouth every evening. 12/31/19   [provider]  venlafaxine XR (EFFEXOR-XR) 75 MG 24 hr capsule Take 75 mg by mouth daily. 01/27/20   [provider]    Allergies Tetracyclines & related  Family History  Problem Relation Age of Onset   Seizures Mother    Lung cancer Maternal Grandfather    Breast cancer Maternal Aunt     Social History Social History   Tobacco Use  Smoking status: Every Day   Smokeless tobacco: Never  Substance Use Topics   Alcohol use: No   Drug use: No    Review of Systems Level 5 caveat:  history/ROS limited by acute/critical illness  ____________________________________________   PHYSICAL EXAM:  VITAL SIGNS: ED Triage Vitals  Enc Vitals Group     BP 02/04/21 2350 (!) 119/99     Pulse Rate 02/04/21 2350 74     Resp 02/04/21 2350 (!) 38     Temp 02/04/21 2350 98.7 F (37.1 C)     Temp Source 02/04/21 2350 Oral     SpO2 02/04/21 2350 100 %     Weight 02/04/21 2346 115.7 kg (255 lb 1.2 oz)     Height 02/04/21 2346 1.6 m (5\' 3" )     Head Circumference --      Peak Flow --      Pain Score 02/04/21 2346 0      Pain Loc --      Pain Edu? --      Excl. in GC? --     Constitutional: Awake and alert, will answer simple questions but her altered mental status makes it difficult to focus. Eyes: Conjunctivae are normal.  Pupils are equal and reactive.  Extraocular motions intact.  No nystagmus. Head: Atraumatic. Nose: No congestion/rhinnorhea. Mouth/Throat: Patient is wearing a mask. Neck: No stridor.  No meningeal signs.   Cardiovascular: Normal rate, regular rhythm. Good peripheral circulation. Respiratory: Patient is hyperventilating with rapid shallow breaths but lungs are clear to auscultation bilaterally. Gastrointestinal: Morbid obesity.  Soft and nontender. No distention.  Musculoskeletal: No lower extremity tenderness nor edema. No gross deformities of extremities. Neurologic: Patient has somewhat rapid and stuttering speech but when repeatedly asked to calm down and take a deep breath, she is able to focus and speak more clearly.  She was initially complaining of some numbness to her left hand but she is moving all 4 extremities with a great deal of encouragement.  Initially she claims she could not move but after encouragement she was able to stand up with some assistance from her wheelchair, pivot, and relax back down onto the bed. Skin:  Skin is warm, diaphoretic and intact. Psychiatric: Mood and affect are most consistent with a panic or anxiety attack.  She is unable to provide much in the way of history.  She mentioned that she is told that she has "bipolar depression" but she claims that that is not the case.  She is not expressing any suicidal or homicidal ideation.  ____________________________________________   LABS (all labs ordered are listed, but only abnormal results are displayed)  Labs Reviewed  CBC WITH DIFFERENTIAL/PLATELET - Abnormal; Notable for the following components:      Result Value   RDW 16.1 (*)    All other components within normal limits  COMPREHENSIVE  METABOLIC PANEL - Abnormal; Notable for the following components:   Creatinine, Ser 1.38 (*)    Calcium 8.2 (*)    GFR, Estimated 47 (*)    All other components within normal limits  VALPROIC ACID LEVEL - Abnormal; Notable for the following components:   Valproic Acid Lvl 37 (*)    All other components within normal limits  ETHANOL  LIPASE, BLOOD  MAGNESIUM   ____________________________________________  EKG  ED ECG REPORT I, 04/06/21, the attending physician, personally viewed and interpreted this ECG.  Date: 02/05/2021 EKG Time: 00: 13 :01 Rate: 68 Rhythm: normal sinus rhythm QRS Axis: normal  Intervals: normal ST/T Wave abnormalities: Non-specific ST segment / T-wave changes, but no clear evidence of acute ischemia. Narrative Interpretation: no definitive evidence of acute ischemia; does not meet STEMI criteria.  ED ECG REPORT I, Loleta Rose, the attending physician, personally viewed and interpreted this ECG.  Date: 02/05/2021 EKG Time: 00: 13: 44 Rate: 69 Rhythm: normal sinus rhythm QRS Axis: normal Intervals: normal ST/T Wave abnormalities: Non-specific ST segment / T-wave changes, but no clear evidence of acute ischemia. Narrative Interpretation: no definitive evidence of acute ischemia; does not meet STEMI criteria.  ____________________________________________  RADIOLOGY I, Loleta Rose, personally viewed and evaluated these images (plain radiographs) as part of my medical decision making, as well as reviewing the written report by the radiologist.  ED MD interpretation: No acute abnormalities identified on head CT but with chronic infarctions  Official radiology report(s): CT HEAD WO CONTRAST  Result Date: 02/05/2021 CLINICAL DATA:  Altered mental status EXAM: CT HEAD WITHOUT CONTRAST TECHNIQUE: Contiguous axial images were obtained from the base of the skull through the vertex without intravenous contrast. COMPARISON:  04/08/2020 FINDINGS: Brain: Again  seen are changes consistent with prior infarcts involving the bilateral frontal lobes and left occipital lobe and right parietooccipital region. Additionally small lacunar infarcts are noted within the cerebellar hemispheres. No findings to suggest acute hemorrhage, acute infarction or space-occupying mass lesion are noted. Vascular: No hyperdense vessel or unexpected calcification. Skull: Normal. Negative for fracture or focal lesion. Sinuses/Orbits: No acute finding. Other: None. IMPRESSION: Chronic infarcts as described.  No acute abnormality noted. Electronically Signed   By: Alcide Clever M.D.   On: 02/05/2021 00:52   MR BRAIN WO CONTRAST  Result Date: 02/05/2021 CLINICAL DATA:  Transient ischemic attack EXAM: MRI HEAD WITHOUT CONTRAST TECHNIQUE: Multiplanar, multiecho pulse sequences of the brain and surrounding structures were obtained without intravenous contrast. COMPARISON:  None. FINDINGS: Brain: No acute infarct, mass effect or extra-axial collection. No acute hemorrhage. There is multifocal hyperintense T2-weighted signal within the white matter. Advanced atrophy for age. Encephalomalacia within both frontal lobes and both occipital lobes. Old bilateral cerebellar infarcts. The midline structures are normal. Vascular: Major flow voids are preserved. Skull and upper cervical spine: Normal calvarium and skull base. Visualized upper cervical spine and soft tissues are normal. Sinuses/Orbits:No paranasal sinus fluid levels or advanced mucosal thickening. No mastoid or middle ear effusion. Normal orbits. IMPRESSION: 1. No acute intracranial abnormality. 2. Bilateral frontal and occipital encephalomalacia, most consistent with remote traumatic brain injury. Electronically Signed   By: Deatra Robinson M.D.   On: 02/05/2021 04:04    ____________________________________________   PROCEDURES   Procedure(s) performed (including Critical Care):  .Critical Care  Date/Time: 02/05/2021 1:29 AM Performed by:  Loleta Rose, MD Authorized by: Loleta Rose, MD   Critical care provider statement:    Critical care time (minutes):  45   Critical care time was exclusive of:  Separately billable procedures and treating other patients   Critical care was necessary to treat or prevent imminent or life-threatening deterioration of the following conditions:  CNS failure or compromise   Critical care was time spent personally by me on the following activities:  Development of treatment plan with patient or surrogate, discussions with consultants, evaluation of patient's response to treatment, examination of patient, obtaining history from patient or surrogate, ordering and performing treatments and interventions, ordering and review of laboratory studies, ordering and review of radiographic studies, pulse oximetry, re-evaluation of patient's condition and review of old charts .1-3 Lead EKG Interpretation  Date/Time: 02/05/2021 4:26 AM Performed by: Loleta Rose, MD Authorized by: Loleta Rose, MD     Interpretation: normal     ECG rate:  65   ECG rate assessment: normal     Rhythm: sinus rhythm     Ectopy: none     Conduction: normal     ____________________________________________   INITIAL IMPRESSION / MDM / ASSESSMENT AND PLAN / ED COURSE  As part of my medical decision making, I reviewed the following data within the electronic MEDICAL RECORD NUMBER History obtained from family, Nursing notes reviewed and incorporated, Labs reviewed , EKG interpreted , Old chart reviewed, and Notes from prior ED visits   Differential diagnosis includes, but is not limited to, panic/anxiety attack, medication or drug side effect, seizure, CVA.  Acute infection is less likely.  The patient is on the cardiac monitor to evaluate for evidence of arrhythmia and/or significant heart rate changes.  Initially the patient was brought to the room with some concerns about whether or not she should be a code stroke.  After my  initial assessment I do not feel that she meets criteria.  Her exam is extremely variable and with encouragement and distraction, she is able to move her extremities, stop the stuttering speech, and began to relax and decrease her respiratory rate.  I looked through her medications and see she takes Lamictal and valproic acid.  I have ordered a broad laboratory evaluation including a valproic acid level.  Ordering stat head CT but I have a low suspicion for intracranial bleed.  I am having the nurses perform a stroke swallow screen and also providing Ativan 2 mg by mouth to see if this helps.  I reviewed the EKGs and there is no sign of ischemia.  Vital signs are stable.     Clinical Course as of 02/05/21 2836  Wynelle Link Feb 05, 2021  0123 CT HEAD WO CONTRAST Head CT is generally reassuring with no sign of intracranial hemorrhage and no obvious CVA, just some chronic changes consistent with her prior history. [CF]  0141 CBC with Differential/Platelet(!) Normal CBC [CF]  0214 Patient is feeling much better.  She is acting more "normal" at this time, no more stuttering speech, normal affect.  No gross cranial nerve nor focal neurological deficits.  However given her history and the as yet unexplained episode earlier tonight (unless it is simply an issue of anxiety or panic attack), the patient, her son, and I agreed to proceed with MRI brain to rule out CVA.  They agree that if this is normal they are comfortable going home assuming that her symptoms remain resolved. [CF]  0425 Patient has been sleeping comfortably.  I woke her up and the son and let them know that the MRI is reassuring with no sign of acute CVA.  The patient and the son are comfortable going home.  I encouraged close outpatient follow-up and gave my usual and customary return precautions. [CF]    Clinical Course User Index [CF] Loleta Rose, MD     ____________________________________________  FINAL CLINICAL IMPRESSION(S) / ED  DIAGNOSES  Final diagnoses:  Transient alteration of awareness     MEDICATIONS GIVEN DURING THIS VISIT:  Medications  LORazepam (ATIVAN) tablet 2 mg (2 mg Oral Given 02/05/21 0051)     ED Discharge Orders     None        Note:  This document was prepared using Dragon voice recognition software and may include unintentional dictation errors.  Loleta Rose, MD 02/05/21 [id number]

## 2021-02-05 NOTE — ED Notes (Signed)
Patient transported to CT 

## 2021-02-05 NOTE — Discharge Instructions (Addendum)
Your workup in the Emergency Department today was reassuring.  We did not find any specific abnormalities.  We recommend you drink plenty of fluids, take your regular medications and/or any new ones prescribed today, and follow up with the doctor(s) listed in these documents as recommended.  Return to the Emergency Department if you develop new or worsening symptoms that concern you.  

## 2021-07-26 IMAGING — MG DIGITAL DIAGNOSTIC BILAT W/ TOMO W/ CAD
8 series · 8 of 24 positions shown · non-contrast
Comparison: Previous exam(s).

CLINICAL DATA: BI-RADS 3 follow-up of a RIGHT breast asymmetry
initiated in 9077.

EXAM:
DIGITAL DIAGNOSTIC BILATERAL MAMMOGRAM WITH TOMOSYNTHESIS AND CAD
TECHNIQUE: Bilateral digital diagnostic mammography and breast tomosynthesis
was performed. The images were evaluated with computer-aided
detection.

[L CC synth-2D]
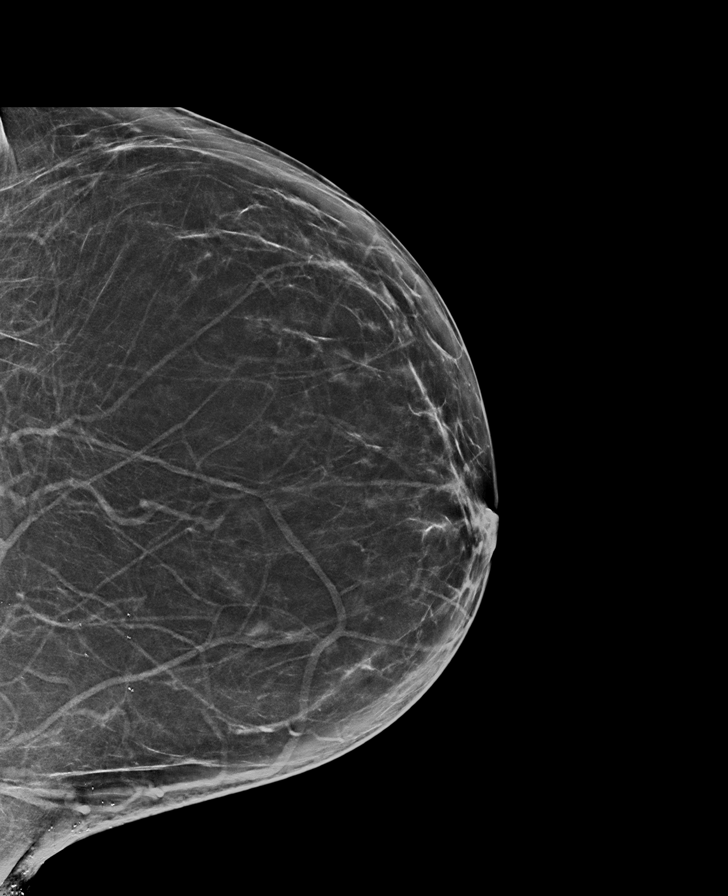

[R MLO synth-2D]
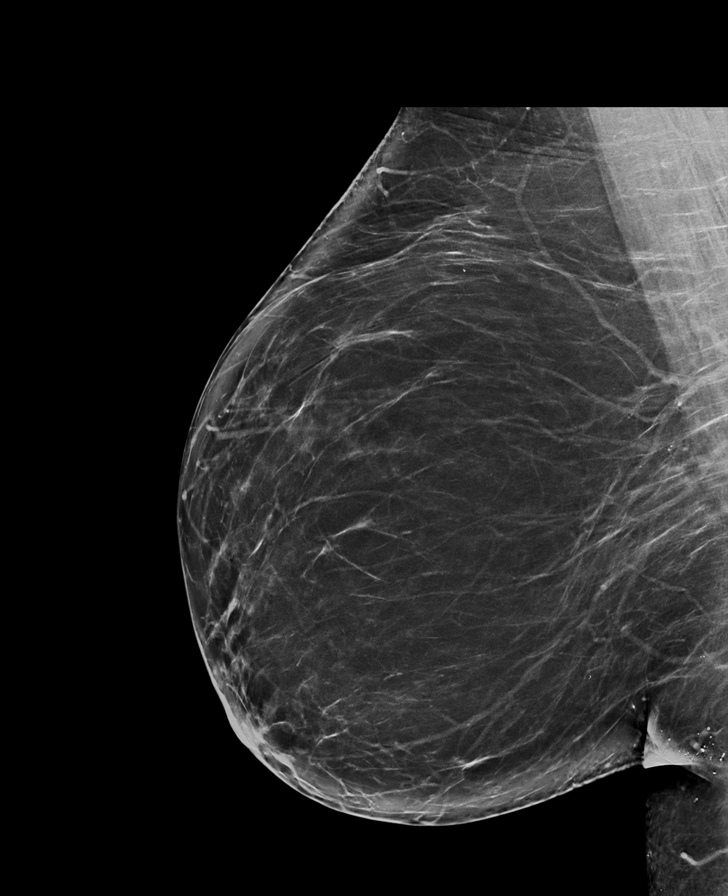

[L MLO synth-2D]
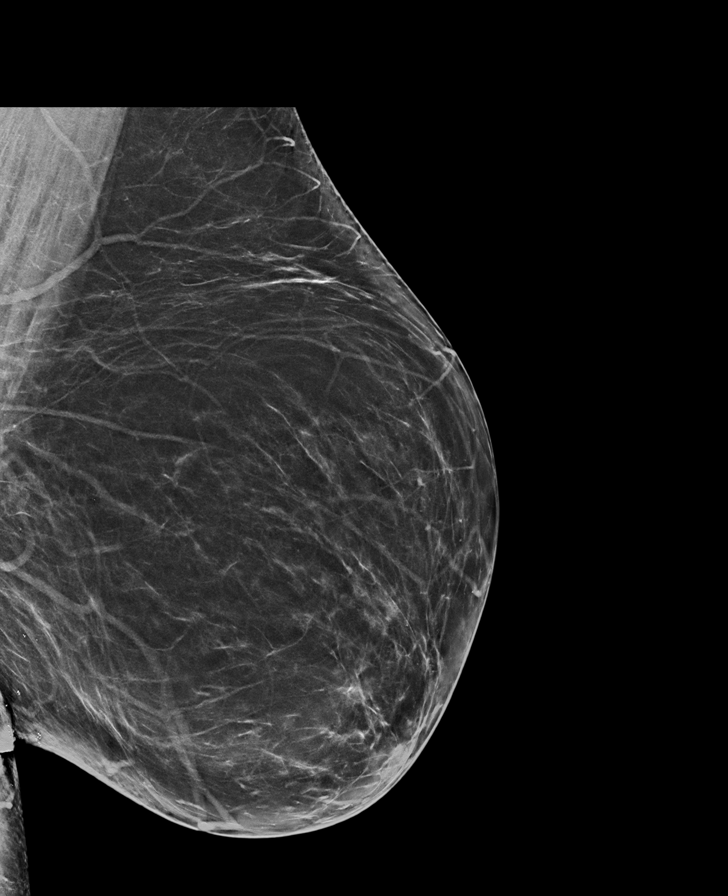

[R CC synth-2D]
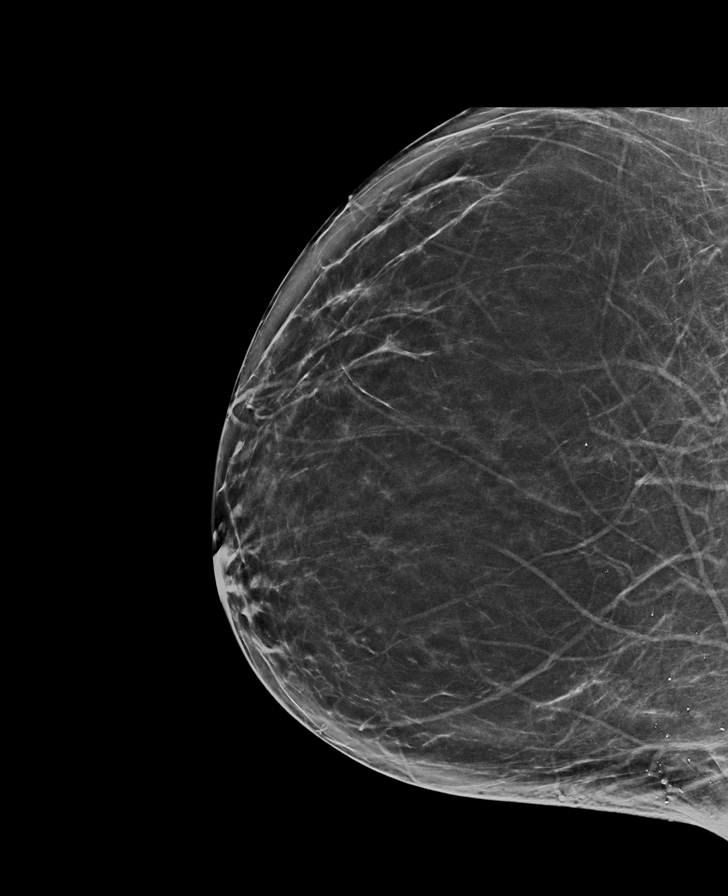

[R CC tomo · tomo slice 39/76.0]
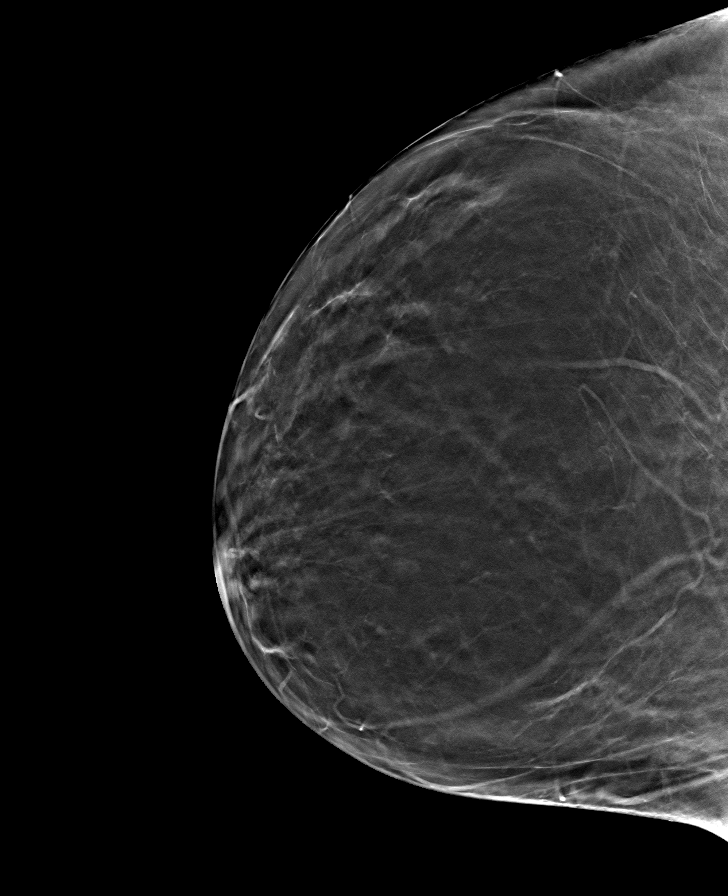

[L CC tomo · tomo slice 39/77.0]
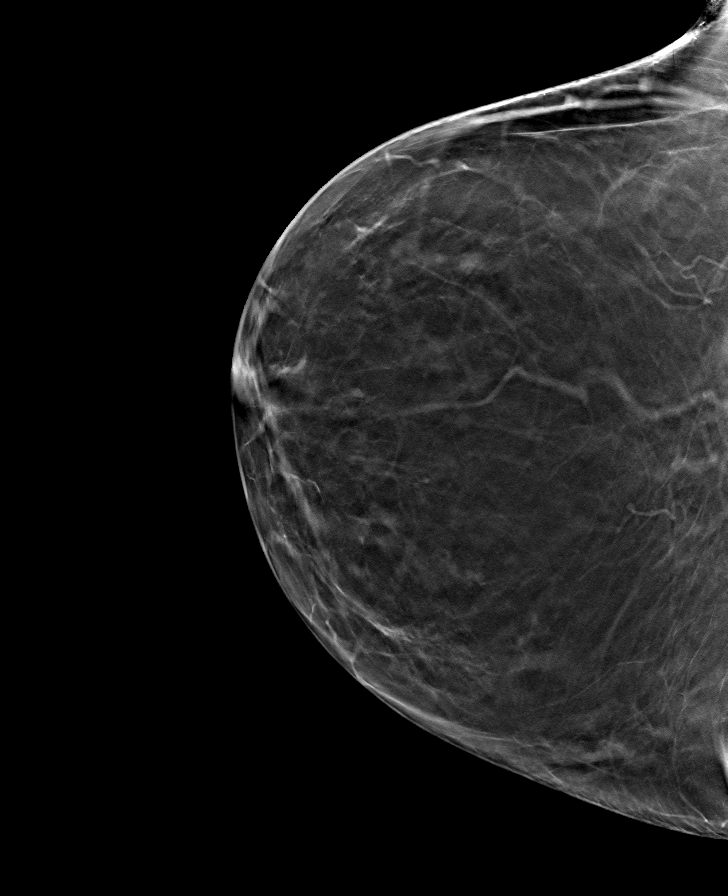

[L MLO tomo · tomo slice 40/79.0]
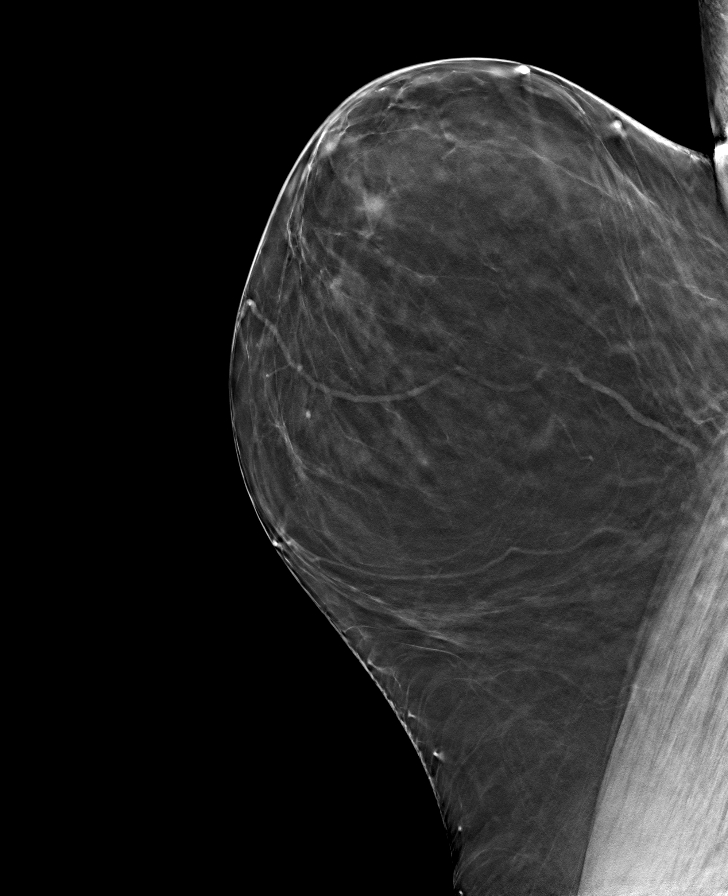

[R MLO tomo · tomo slice 45/89.0]
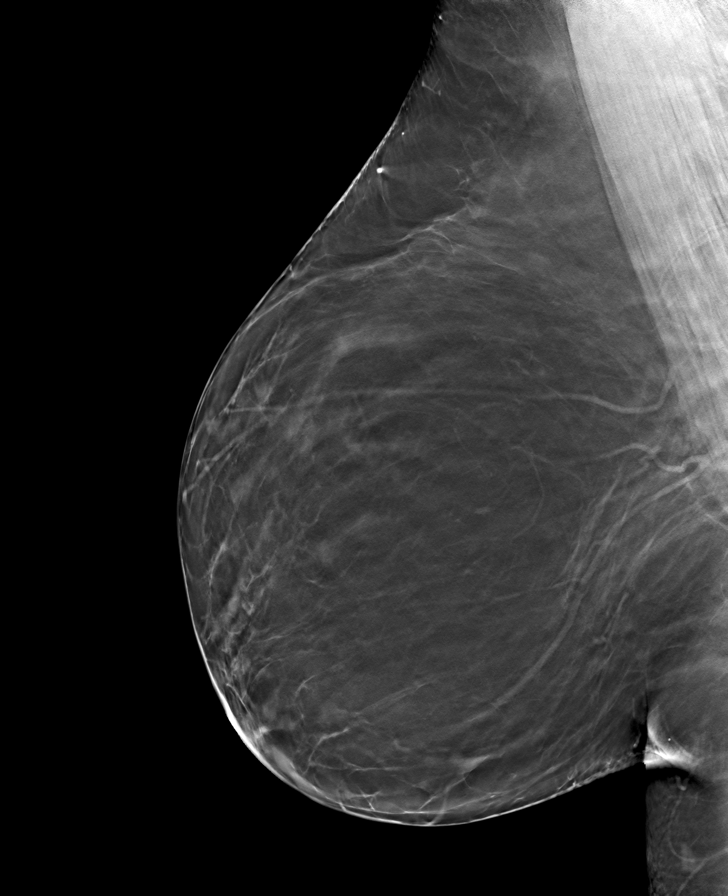

[8 of 24 positions shown; findings below may reference images not displayed]

ACR Breast Density Category b: There are scattered areas of
fibroglandular density.
FINDINGS: Previously described asymmetry in the RIGHT upper inner breast at
posterior depth is less conspicuous on current exam in comparison to
prior exam from 3162. No suspicious mass, distortion, or
microcalcifications are identified to suggest presence of malignancy
bilaterally.
IMPRESSION: No mammographic evidence of malignancy bilaterally. Stable to
decreased conspicuity of previously described asymmetry in the RIGHT
upper inner breast at posterior depth. This has been stable for
greater than 2 years, consistent with a benign etiology.

RECOMMENDATION:
Screening mammogram in one year.(Code:[id number])

I have discussed the findings and recommendations with the patient.
If applicable, a reminder letter will be sent to the patient
regarding the next appointment.

BI-RADS CATEGORY  2: Benign.

## 2022-05-10 ENCOUNTER — Other Ambulatory Visit: Payer: Self-pay | Admitting: Internal Medicine

## 2022-05-10 DIAGNOSIS — Z1231 Encounter for screening mammogram for malignant neoplasm of breast: Secondary | ICD-10-CM

## 2022-05-13 ENCOUNTER — Encounter: Payer: Self-pay | Admitting: Intensive Care

## 2022-05-13 ENCOUNTER — Emergency Department: Payer: Medicare Other

## 2022-05-13 ENCOUNTER — Inpatient Hospital Stay
Admission: EM | Admit: 2022-05-13 | Discharge: 2022-05-15 | DRG: 194 | Disposition: A | Discharge status: Home / Self Care | Payer: Medicare Other | Attending: Internal Medicine | Admitting: Internal Medicine

## 2022-05-13 ENCOUNTER — Other Ambulatory Visit: Payer: Self-pay

## 2022-05-13 DIAGNOSIS — Z6841 Body Mass Index (BMI) 40.0 and over, adult: Secondary | ICD-10-CM | POA: Diagnosis not present

## 2022-05-13 DIAGNOSIS — D6959 Other secondary thrombocytopenia: Secondary | ICD-10-CM | POA: Diagnosis present

## 2022-05-13 DIAGNOSIS — N1831 Chronic kidney disease, stage 3a: Secondary | ICD-10-CM | POA: Diagnosis present

## 2022-05-13 DIAGNOSIS — J189 Pneumonia, unspecified organism: Secondary | ICD-10-CM | POA: Diagnosis present

## 2022-05-13 DIAGNOSIS — I959 Hypotension, unspecified: Secondary | ICD-10-CM | POA: Diagnosis present

## 2022-05-13 DIAGNOSIS — Z7902 Long term (current) use of antithrombotics/antiplatelets: Secondary | ICD-10-CM

## 2022-05-13 DIAGNOSIS — D72818 Other decreased white blood cell count: Secondary | ICD-10-CM | POA: Diagnosis present

## 2022-05-13 DIAGNOSIS — J123 Human metapneumovirus pneumonia: Principal | ICD-10-CM | POA: Diagnosis present

## 2022-05-13 DIAGNOSIS — D696 Thrombocytopenia, unspecified: Secondary | ICD-10-CM | POA: Insufficient documentation

## 2022-05-13 DIAGNOSIS — R519 Headache, unspecified: Secondary | ICD-10-CM

## 2022-05-13 DIAGNOSIS — Z803 Family history of malignant neoplasm of breast: Secondary | ICD-10-CM | POA: Diagnosis not present

## 2022-05-13 DIAGNOSIS — R0902 Hypoxemia: Secondary | ICD-10-CM | POA: Diagnosis present

## 2022-05-13 DIAGNOSIS — J40 Bronchitis, not specified as acute or chronic: Secondary | ICD-10-CM | POA: Diagnosis present

## 2022-05-13 DIAGNOSIS — Z801 Family history of malignant neoplasm of trachea, bronchus and lung: Secondary | ICD-10-CM

## 2022-05-13 DIAGNOSIS — Z862 Personal history of diseases of the blood and blood-forming organs and certain disorders involving the immune mechanism: Secondary | ICD-10-CM

## 2022-05-13 DIAGNOSIS — N183 Chronic kidney disease, stage 3 unspecified: Secondary | ICD-10-CM | POA: Diagnosis present

## 2022-05-13 DIAGNOSIS — D72819 Decreased white blood cell count, unspecified: Secondary | ICD-10-CM

## 2022-05-13 DIAGNOSIS — D6861 Antiphospholipid syndrome: Secondary | ICD-10-CM | POA: Diagnosis present

## 2022-05-13 DIAGNOSIS — R042 Hemoptysis: Secondary | ICD-10-CM | POA: Diagnosis present

## 2022-05-13 DIAGNOSIS — R051 Acute cough: Principal | ICD-10-CM

## 2022-05-13 DIAGNOSIS — Z8673 Personal history of transient ischemic attack (TIA), and cerebral infarction without residual deficits: Secondary | ICD-10-CM | POA: Diagnosis not present

## 2022-05-13 DIAGNOSIS — F32A Depression, unspecified: Secondary | ICD-10-CM | POA: Diagnosis present

## 2022-05-13 DIAGNOSIS — G40909 Epilepsy, unspecified, not intractable, without status epilepticus: Secondary | ICD-10-CM

## 2022-05-13 DIAGNOSIS — Z20822 Contact with and (suspected) exposure to covid-19: Secondary | ICD-10-CM | POA: Diagnosis present

## 2022-05-13 DIAGNOSIS — Z79899 Other long term (current) drug therapy: Secondary | ICD-10-CM | POA: Diagnosis not present

## 2022-05-13 DIAGNOSIS — I639 Cerebral infarction, unspecified: Secondary | ICD-10-CM | POA: Diagnosis present

## 2022-05-13 DIAGNOSIS — Z789 Other specified health status: Secondary | ICD-10-CM

## 2022-05-13 DIAGNOSIS — R0602 Shortness of breath: Secondary | ICD-10-CM | POA: Diagnosis present

## 2022-05-13 DIAGNOSIS — Z7982 Long term (current) use of aspirin: Secondary | ICD-10-CM | POA: Diagnosis not present

## 2022-05-13 DIAGNOSIS — E860 Dehydration: Secondary | ICD-10-CM

## 2022-05-13 DIAGNOSIS — J188 Other pneumonia, unspecified organism: Secondary | ICD-10-CM | POA: Diagnosis present

## 2022-05-13 DIAGNOSIS — F17210 Nicotine dependence, cigarettes, uncomplicated: Secondary | ICD-10-CM | POA: Diagnosis present

## 2022-05-13 DIAGNOSIS — E66813 Obesity, class 3: Secondary | ICD-10-CM | POA: Insufficient documentation

## 2022-05-13 LAB — PROTIME-INR
INR: 1 (ref 0.8–1.2)
Prothrombin Time: 13 seconds (ref 11.4–15.2)

## 2022-05-13 LAB — BASIC METABOLIC PANEL
Anion gap: 5 (ref 5–15)
BUN: 14 mg/dL (ref 6–20)
CO2: 25 mmol/L (ref 22–32)
Calcium: 7.8 mg/dL — ABNORMAL LOW (ref 8.9–10.3)
Chloride: 113 mmol/L — ABNORMAL HIGH (ref 98–111)
Creatinine, Ser: 1.37 mg/dL — ABNORMAL HIGH (ref 0.44–1.00)
GFR, Estimated: 47 mL/min — ABNORMAL LOW (ref >60)
Glucose, Bld: 91 mg/dL (ref 70–99)
Potassium: 3.6 mmol/L (ref 3.5–5.1)
Sodium: 143 mmol/L (ref 135–145)

## 2022-05-13 LAB — RESP PANEL BY RT-PCR (FLU A&B, COVID) ARPGX2
Influenza A by PCR: NEGATIVE
Influenza B by PCR: NEGATIVE
SARS Coronavirus 2 by RT PCR: NEGATIVE

## 2022-05-13 LAB — CBC
HCT: 42.4 % (ref 36.0–46.0)
Hemoglobin: 13.6 g/dL (ref 12.0–15.0)
MCH: 26.2 pg (ref 26.0–34.0)
MCHC: 32.1 g/dL (ref 30.0–36.0)
MCV: 81.7 fL (ref 80.0–100.0)
Platelets: 76 10*3/uL — ABNORMAL LOW (ref 150–400)
RBC: 5.19 MIL/uL — ABNORMAL HIGH (ref 3.87–5.11)
RDW: 15.5 % (ref 11.5–15.5)
WBC: 2.4 10*3/uL — ABNORMAL LOW (ref 4.0–10.5)
nRBC: 0 % (ref 0.0–0.2)

## 2022-05-13 LAB — STREP PNEUMONIAE URINARY ANTIGEN: Strep Pneumo Urinary Antigen: NEGATIVE

## 2022-05-13 LAB — TROPONIN I (HIGH SENSITIVITY)
Troponin I (High Sensitivity): 6 ng/L (ref <18)
Troponin I (High Sensitivity): 6 ng/L (ref <18)

## 2022-05-13 LAB — D-DIMER, QUANTITATIVE: D-Dimer, Quant: 0.72 ug/mL-FEU — ABNORMAL HIGH (ref 0.00–0.50)

## 2022-05-13 LAB — PROCALCITONIN: Procalcitonin: <0.1 ng/mL

## 2022-05-13 LAB — TYPE AND SCREEN

## 2022-05-13 MED ORDER — SODIUM CHLORIDE 0.9 % IV SOLN: 2.0000 g | INTRAVENOUS | Status: DC

## 2022-05-13 MED ORDER — CLOPIDOGREL BISULFATE 75 MG PO TABS
75.0000 mg | ORAL_TABLET | Freq: Every day | ORAL | Status: DC
  Administered 2022-05-14 – 2022-05-15 (×2): 75 mg via ORAL
  Filled 2022-05-13 (×2): qty 1

## 2022-05-13 MED ORDER — LAMOTRIGINE 100 MG PO TABS
200.0000 mg | ORAL_TABLET | Freq: Every day | ORAL | Status: DC
  Administered 2022-05-13 – 2022-05-14 (×2): 200 mg via ORAL
  Filled 2022-05-13 (×2): qty 2

## 2022-05-13 MED ORDER — ATORVASTATIN CALCIUM 20 MG PO TABS
10.0000 mg | ORAL_TABLET | Freq: Every day | ORAL | Status: DC
  Administered 2022-05-14 – 2022-05-15 (×2): 10 mg via ORAL
  Filled 2022-05-13 (×2): qty 1

## 2022-05-13 MED ORDER — ONDANSETRON HCL 4 MG/2ML IJ SOLN: 4.0000 mg | Freq: Four times a day (QID) | INTRAMUSCULAR | Status: DC | PRN

## 2022-05-13 MED ORDER — DIVALPROEX SODIUM 250 MG PO DR TAB
250.0000 mg | DELAYED_RELEASE_TABLET | Freq: Every day | ORAL | Status: DC
  Administered 2022-05-14 – 2022-05-15 (×2): 250 mg via ORAL
  Filled 2022-05-13 (×2): qty 1

## 2022-05-13 MED ORDER — SODIUM CHLORIDE 0.9 % IV SOLN: 500.0000 mg | INTRAVENOUS | Status: DC

## 2022-05-13 MED ORDER — SODIUM CHLORIDE 0.9 % IV BOLUS
1000.0000 mL | Freq: Once | INTRAVENOUS | Status: AC
  Administered 2022-05-13: 1000 mL via INTRAVENOUS

## 2022-05-13 MED ORDER — SODIUM CHLORIDE 0.9 % IV SOLN
2.0000 g | Freq: Once | INTRAVENOUS | Status: AC
  Administered 2022-05-13: 2 g via INTRAVENOUS
  Filled 2022-05-13: qty 20

## 2022-05-13 MED ORDER — SODIUM CHLORIDE 0.9 % IV SOLN
500.0000 mg | Freq: Once | INTRAVENOUS | Status: AC
  Administered 2022-05-13: 500 mg via INTRAVENOUS
  Filled 2022-05-13: qty 5

## 2022-05-13 MED ORDER — DIPHENHYDRAMINE HCL 50 MG/ML IJ SOLN
12.5000 mg | Freq: Once | INTRAMUSCULAR | Status: AC
  Administered 2022-05-13: 12.5 mg via INTRAVENOUS
  Filled 2022-05-13: qty 1

## 2022-05-13 MED ORDER — LAMOTRIGINE 100 MG PO TABS
100.0000 mg | ORAL_TABLET | Freq: Every day | ORAL | Status: DC
  Administered 2022-05-14 – 2022-05-15 (×2): 100 mg via ORAL
  Filled 2022-05-13 (×2): qty 1

## 2022-05-13 MED ORDER — IOHEXOL 350 MG/ML SOLN
75.0000 mL | Freq: Once | INTRAVENOUS | Status: AC | PRN
  Administered 2022-05-13: 75 mL via INTRAVENOUS

## 2022-05-13 MED ORDER — HYDROCODONE-ACETAMINOPHEN 5-325 MG PO TABS: 1.0000 | ORAL_TABLET | ORAL | Status: DC | PRN

## 2022-05-13 MED ORDER — METOCLOPRAMIDE HCL 5 MG/ML IJ SOLN
10.0000 mg | Freq: Once | INTRAMUSCULAR | Status: AC
  Administered 2022-05-13: 10 mg via INTRAVENOUS
  Filled 2022-05-13: qty 2

## 2022-05-13 MED ORDER — ONDANSETRON HCL 4 MG PO TABS: 4.0000 mg | ORAL_TABLET | Freq: Four times a day (QID) | ORAL | Status: DC | PRN

## 2022-05-13 MED ORDER — ASPIRIN 81 MG PO TBEC
81.0000 mg | DELAYED_RELEASE_TABLET | Freq: Every day | ORAL | Status: DC
  Administered 2022-05-14 – 2022-05-15 (×2): 81 mg via ORAL
  Filled 2022-05-13 (×2): qty 1

## 2022-05-13 MED ORDER — VENLAFAXINE HCL ER 75 MG PO CP24
75.0000 mg | ORAL_CAPSULE | Freq: Every day | ORAL | Status: DC
  Administered 2022-05-14 – 2022-05-15 (×2): 75 mg via ORAL
  Filled 2022-05-13 (×2): qty 1

## 2022-05-13 MED ORDER — SODIUM CHLORIDE 0.9 % IV SOLN: INTRAVENOUS | Status: DC

## 2022-05-13 MED ORDER — ACETAMINOPHEN 325 MG PO TABS: 650.0000 mg | ORAL_TABLET | Freq: Four times a day (QID) | ORAL | Status: DC | PRN

## 2022-05-13 MED ORDER — ACETAMINOPHEN 650 MG RE SUPP: 650.0000 mg | Freq: Four times a day (QID) | RECTAL | Status: DC | PRN

## 2022-05-13 NOTE — Assessment & Plan Note (Addendum)
Hypoxia Patient with a 2-week history of cough, associated with hemoptysis over the past couple days and failing outpatient treatment with Zithromax.  O2 sats in the low 90s on room air WBC depressed to 2400 and procalcitonin less than 0.1, COVID and flu negative Suspect viral infection but will empirically cover with Rocephin and azithromycin Antitussives, incentive spirometer and supportive care DuoNebs as needed Supplemental oxygen to keep sats over 94%

## 2022-05-13 NOTE — Assessment & Plan Note (Signed)
Continue venlafaxine 

## 2022-05-13 NOTE — Assessment & Plan Note (Signed)
Continue lamotrigine and Depakote

## 2022-05-13 NOTE — Assessment & Plan Note (Addendum)
Thrombocytopenia Platelets 76,000, down from 200,000 a year ago, most recent on record. S/p BM biopsy at [facility] on 01/25/17 , unable to find results in Care Everywhere Patient was treated with tapering steroids.  No apparent further problems.  Had no further hematology follow-up The ED provider reached out to the heme-onc, Dr. Orlie Dakin who advised to continue usual care.  No consult placed. Follow platelets Monitor for worsening hemoptysis

## 2022-05-13 NOTE — H&P (Signed)
History and Physical    Patient: Dawn Stein YNW:[id number] DOB: 12/27/1970 DOA: 05/13/2022 DOS: the patient was seen and examined on 05/13/2022 PCP: Barbette ReichmannHande, Vishwanath, MD  Patient coming from: Home  Chief Complaint:  Chief Complaint  Patient presents with   Cough   Emesis    HPI: Dawn Stein is a 51 y.o. female with medical history significant for Class III obesity, CKD 3A, depression, seizure disorder, antiphospholipid syndrome with multiple CVAs without residual deficits, seizure disorder, ITP in 2018 in the setting of acute stroke, and with hospitalization in 2021 with multifocal pneumonia who presents to the ED with a 2-week history of coughing associated with mild shortness of breath and without chest pain, fever or chills.  She started having intermittent nonbloody nonbilious vomiting in the past several days and over the past 2 days has had blood-tinged sputum.  She saw her PCP on 11/9 and was started on Z-Pak without much improvement in symptoms, thus presenting to the ED.  Patient has had headaches during this time without visual disturbance or one-sided numbness weakness or tingling.  She denies abdominal pain or diarrhea. ED course and data review: BP 92/50 on arrival with pulse 67.  O2 sat occasionally dropping to 92-93 on room air.  Labs significant for WBC 2400 Hb 13.6 and platelet count 76,000, down from 200,000 a year prior.  BMP shows creatinine of 1.37 which is about her baseline.  D-dimer 0.72.  Procalcitonin less than 0.10, troponin 6.  COVID and flu negative. EKG, personally viewed and interpreted shows NSR at 69 with nonspecific ST-T wave changes.Head CT showed no acute intracranial abnormalities but did show multiple old stable infarcts. CT angio chest was negative for PE but showed multifocal pneumonia as further outlined below: IMPRESSION: 1. No evidence of a pulmonary embolism. 2. Numerous ill-defined small nodular opacities throughout the  right lung, mostly ground-glass, consistent with multifocal infection with an atypical etiology suspected. Recommend follow-up CT in 2-3 months to document improvement/resolution.  Patient was treated with Rocephin and azithromycin and given an IV fluid bolus as well as antiemetics.  The ED provider spoke with oncologist Dr. Orlie DakinFinnegan regarding thrombocytopenia and hemoptysis and he advised to continue usual care.  Hospitalist consulted for admission.   Review of Systems: As mentioned in the history of present illness. All other systems reviewed and are negative.  Past Medical History:  Diagnosis Date   Closed head injury    at age 51   Seizures (HCC)    Stroke [facility](HCC)    Past Surgical History:  Procedure Laterality Date   none     Social History:  reports that she has been smoking cigarettes. She has never used smokeless tobacco. She reports current drug use. Drug: Marijuana. She reports that she does not drink alcohol.  Allergies  Allergen Reactions   Tetracyclines & Related Anaphylaxis    Facial swelling    Family History  Problem Relation Age of Onset   Seizures Mother    Lung cancer Maternal Grandfather    Breast cancer Maternal Aunt     Prior to Admission medications   Medication Sig Start Date End Date Taking? Authorizing Provider  amLODipine (NORVASC) 5 MG tablet Take 5 mg by mouth daily. 12/17/19   [provider]  aspirin EC 81 MG tablet Take 81 mg by mouth daily.    [provider]  atorvastatin (LIPITOR) 10 MG tablet Take 10 mg by mouth daily.    [provider]  azithromycin Christena Deem(ZITHROMAX)  250 MG tablet 250mg  daily x 4 days 02/01/20   04/02/20, MD  clopidogrel (PLAVIX) 75 MG tablet Take 75 mg by mouth daily. 10/30/19   [provider]  divalproex (DEPAKOTE) 250 MG DR tablet Take 250 mg by mouth daily.     [provider]  lamoTRIgine (LAMICTAL) 100 MG tablet Take 100 mg by mouth daily. 12/17/19   [provider]  lamoTRIgine (LAMICTAL) 200 MG tablet Take 200 mg by mouth every evening. 12/31/19   [provider]  venlafaxine XR (EFFEXOR-XR) 75 MG 24 hr capsule Take 75 mg by mouth daily. 01/27/20   [provider]    Physical Exam: Vitals:   05/13/22 1557 05/13/22 1600 05/13/22 1601 05/13/22 1800  BP:  96/61  (!) 96/43  Pulse: 71 62 65 66  Resp:  18  18  Temp:    98 F (36.7 C)  TempSrc:    Oral  SpO2: 98% 94% 94% 94%  Weight:      Height:       Physical Exam Vitals and nursing note reviewed.  Constitutional:      General: She is not in acute distress. HENT:     Head: Normocephalic and atraumatic.     Nose: Congestion and rhinorrhea present.  Cardiovascular:     Rate and Rhythm: Normal rate and regular rhythm.     Heart sounds: Normal heart sounds.  Pulmonary:     Effort: Pulmonary effort is normal.     Breath sounds: Transmitted upper airway sounds present. Wheezing present.  Abdominal:     Palpations: Abdomen is soft.     Tenderness: There is no abdominal tenderness.  Neurological:     Mental Status: Mental status is at baseline.     Labs on Admission: I have personally reviewed following labs and imaging studies  CBC: Recent Labs  Lab 05/13/22 1331  WBC 2.4*  HGB 13.6  HCT 42.4  MCV 81.7  PLT 76*   Basic Metabolic Panel: Recent Labs  Lab 05/13/22 1331  NA 143  K 3.6  CL 113*  CO2 25  GLUCOSE 91  BUN 14  CREATININE 1.37*  CALCIUM 7.8*   GFR: Estimated Creatinine Clearance: 59.9 mL/min (A) (by C-G formula based on SCr of 1.37 mg/dL (H)). Liver Function Tests: No results for input(s): "AST", "ALT", "ALKPHOS", "BILITOT", "PROT", "ALBUMIN" in the last 168 hours. No results for input(s): "LIPASE", "AMYLASE" in the last 168 hours. No results for input(s): "AMMONIA" in the last 168 hours. Coagulation Profile: Recent Labs  Lab 05/13/22 1331  INR 1.0   Cardiac Enzymes: No results for input(s): "CKTOTAL", "CKMB", "CKMBINDEX",  "TROPONINI" in the last 168 hours. BNP (last 3 results) No results for input(s): "PROBNP" in the last 8760 hours. HbA1C: No results for input(s): "HGBA1C" in the last 72 hours. CBG: No results for input(s): "GLUCAP" in the last 168 hours. Lipid Profile: No results for input(s): "CHOL", "HDL", "LDLCALC", "TRIG", "CHOLHDL", "LDLDIRECT" in the last 72 hours. Thyroid Function Tests: No results for input(s): "TSH", "T4TOTAL", "FREET4", "T3FREE", "THYROIDAB" in the last 72 hours. Anemia Panel: No results for input(s): "VITAMINB12", "FOLATE", "FERRITIN", "TIBC", "IRON", "RETICCTPCT" in the last 72 hours. Urine analysis:    Component Value Date/Time   COLORURINE YELLOW (A) 04/08/2020 1230   APPEARANCEUR HAZY (A) 04/08/2020 1230   APPEARANCEUR Clear 02/01/2014 2340   LABSPEC 1.016 04/08/2020 1230   LABSPEC 1.012 02/01/2014 2340   PHURINE 7.0 04/08/2020 1230   GLUCOSEU NEGATIVE 04/08/2020  1230   GLUCOSEU Negative 02/01/2014 2340   HGBUR NEGATIVE 04/08/2020 1230   BILIRUBINUR NEGATIVE 04/08/2020 1230   BILIRUBINUR Negative 02/01/2014 2340   KETONESUR 5 (A) 04/08/2020 1230   PROTEINUR NEGATIVE 04/08/2020 1230   NITRITE NEGATIVE 04/08/2020 1230   LEUKOCYTESUR NEGATIVE 04/08/2020 1230   LEUKOCYTESUR Negative 02/01/2014 2340    Radiological Exams on Admission: CT Angio Chest PE W and/or Wo Contrast  Result Date: 05/13/2022 CLINICAL DATA:  Cough. Patient on antibiotics. Coughing up blood tinged sputum today. EXAM: CT ANGIOGRAPHY CHEST WITH CONTRAST TECHNIQUE: Multidetector CT imaging of the chest was performed using the standard protocol during bolus administration of intravenous contrast. Multiplanar CT image reconstructions and MIPs were obtained to evaluate the vascular anatomy. RADIATION DOSE REDUCTION: This exam was performed according to the departmental dose-optimization program which includes automated exposure control, adjustment of the mA and/or kV according to patient size and/or use  of iterative reconstruction technique. CONTRAST:  44mL OMNIPAQUE IOHEXOL 350 MG/ML SOLN COMPARISON:  Current chest radiographs. FINDINGS: Cardiovascular: Pulmonary arteries are well opacified. There is no evidence of a pulmonary embolism. Heart top-normal in size. No pericardial effusion. Coronary arteries are unremarkable. Great vessels are normal in caliber. Minimal enhancement of the aorta. No convincing dissection. No significant atherosclerosis. Mediastinum/Nodes: No neck base, mediastinal or hilar masses. Mildly enlarged right subcarinal lymph node, 1.7 cm short axis. Prominent right hilar lymph nodes. Trachea and esophagus are unremarkable. Lungs/Pleura: Numerous ill-defined small, predominantly ground-glass, nodular opacities throughout the right lung. Minimal linear subsegmental atelectasis at the base of the left lower lobe. Remainder of the left lung is clear. No pleural effusion.  No pneumothorax. Upper Abdomen: Multiple gallstones. No evidence of acute cholecystitis. No acute findings in the visualized upper abdomen. Musculoskeletal: No fracture or acute finding. No bone lesion. No chest wall mass. Review of the MIP images confirms the above findings. IMPRESSION: 1. No evidence of a pulmonary embolism. 2. Numerous ill-defined small nodular opacities throughout the right lung, mostly ground-glass, consistent with multifocal infection with an atypical etiology suspected. Recommend follow-up CT in 2-3 months to document improvement/resolution. Electronically Signed   By: Amie Portland M.D.   On: 05/13/2022 16:45   CT HEAD WO CONTRAST ( )  Result Date: 05/13/2022 CLINICAL DATA:  Sudden severe headache. History of strokes. Patient taking aspirin/Plavix. EXAM: CT HEAD WITHOUT CONTRAST TECHNIQUE: Contiguous axial images were obtained from the base of the skull through the vertex without intravenous contrast. RADIATION DOSE REDUCTION: This exam was performed according to the departmental  dose-optimization program which includes automated exposure control, adjustment of the mA and/or kV according to patient size and/or use of iterative reconstruction technique. COMPARISON:  02/05/2021. FINDINGS: Brain: No evidence of acute infarction, hemorrhage, hydrocephalus, extra-axial collection or mass lesion/mass effect. Old bilateral frontal lobe, right parietooccipital lobe and left occipital lobe infarcts. Vascular: No hyperdense vessel or unexpected calcification. Skull: Normal. Negative for fracture or focal lesion. Sinuses/Orbits: Globes and orbits are unremarkable. Scattered mild to moderate bilateral ethmoid, anterior sphenoid and bilateral maxillary sinus mucosal thickening. Other: None. IMPRESSION: 1. No acute intracranial abnormalities. 2. Multiple old infarcts that are stable compared to the prior head CT. Electronically Signed   By: Amie Portland M.D.   On: 05/13/2022 16:39   DG Chest 2 View  Result Date: 05/13/2022 CLINICAL DATA:  Central chest discomfort.  Cough for 2 weeks. EXAM: CHEST - 2 VIEW COMPARISON:  None Available. FINDINGS: The heart size and mediastinal contours are within normal limits. Bilateral lower lobe hazy lung  opacities, right worse than the left concerning for multifocal pneumonia. IMPRESSION: Bilateral lower lobe hazy lung opacities, right worse than the left concerning for multifocal pneumonia. Follow-up to resolution is recommended. Electronically Signed   By: Larose Hires D.O.   On: 05/13/2022 14:03     Data Reviewed: Relevant notes from primary care and specialist visits, past discharge summaries as available in EHR, including Care Everywhere. Prior diagnostic testing as pertinent to current admission diagnoses Updated medications and problem lists for reconciliation ED course, including vitals, labs, imaging, treatment and response to treatment Triage notes, nursing and pharmacy notes and ED provider's notes Notable results as noted in HPI   Assessment  and Plan: * Multifocal pneumonia with hemoptysis Hypoxia Patient with a 2-week history of cough, associated with hemoptysis over the past couple days and failing outpatient treatment with Zithromax.  O2 sats in the low 90s on room air WBC depressed to 2400 and procalcitonin less than 0.1, COVID and flu negative Suspect viral infection but will empirically cover with Rocephin and azithromycin Antitussives, incentive spirometer and supportive care DuoNebs as needed Supplemental oxygen to keep sats over 94%  History of ITP 2018 Thrombocytopenia Platelets 76,000, down from 200,000 a year ago, most recent on record. S/p BM biopsy at [facility] on 01/25/17 , unable to find results in Care Everywhere Patient was treated with tapering steroids.  No apparent further problems.  Had no further hematology follow-up The ED provider reached out to the heme-onc, Dr. Orlie Dakin who advised to continue usual care.  No consult placed. Follow platelets Monitor for worsening hemoptysis   Headache History of multiple CVAs Patient complained of a headache CT head nonacute, showing multiple stable old infarcts Continue to monitor for worsening given history of antiphospholipid syndrome and mild per prior strokes Tylenol as needed Continue asa, plavix and atorvastatin Neurologic checks  Leukopenia Possibly related to acute infection Continue to monitor   Antiphospholipid antibody syndrome (HCC) History of recurrent CVAs without residual deficits Continue aspirin and Plavix and statins  Obesity, Class III, BMI 40-49.9 (morbid obesity) (HCC) Potential complicating factor to overall prognosis and care  CKD (chronic kidney disease), stage IIIa Renal function at baseline  Depression Continue venlafaxine  Seizure disorder (HCC) Continue lamotrigine and Depakote      DVT prophylaxis: Lovenox  Consults: none  Advance Care Planning:   Code Status: Prior   Family Communication: none  Disposition  Plan: Back to previous home environment  Severity of Illness: The appropriate patient status for this patient is INPATIENT. Inpatient status is judged to be reasonable and necessary in order to provide the required intensity of service to ensure the patient's safety. The patient's presenting symptoms, physical exam findings, and initial radiographic and laboratory data in the context of their chronic comorbidities is felt to place them at high risk for further clinical deterioration. Furthermore, it is not anticipated that the patient will be medically stable for discharge from the hospital within 2 midnights of admission.   * I certify that at the point of admission it is my clinical judgment that the patient will require inpatient hospital care spanning beyond 2 midnights from the point of admission due to high intensity of service, high risk for further deterioration and high frequency of surveillance required.*  Author: Andris Baumann, MD 05/13/2022 8:14 PM  For on call review [email or web address].

## 2022-05-13 NOTE — ED Triage Notes (Signed)
Pt in via EMS from home with c/o persistent cough for 1 month. Pt was seen at [facility] on 11/9, had x-rays to rule out bronchitis. Pt started a new abx at the same time, been on it for 3 days. Pt cough productive with green sputum. Hx of CVA and right sided deficits and slurred speech. 113/80, HR 75, 95% RA, 97.7 temp, CBG 146, per EMS diminished lung sounds to lower lobes

## 2022-05-13 NOTE — Assessment & Plan Note (Signed)
Possibly related to acute infection Continue to monitor

## 2022-05-13 NOTE — ED Triage Notes (Addendum)
Arrived by EMS from home. Patient c/o cough X2 weeks. Reports emesis X1 week and the past two days having streaks of blood in emesis. KC started patient on antibiotic 11/9 and reports not feeling better. Also c/o left sided headache intermittently X2 weeks and central chest discomfort for a few days.  Hx stroke. Reports ambulatory with no cane or walker at baseline now.

## 2022-05-13 NOTE — Assessment & Plan Note (Signed)
Potential complicating factor to overall prognosis and care 

## 2022-05-13 NOTE — ED Provider Notes (Signed)
Procedures     ----------------------------------------- 7:29 PM on 05/13/2022 ----------------------------------------- CT angiogram negative for PE, but does confirm right lung multifocal pneumonia.  CT head unremarkable.  Discussed with patient who reports with the severity of her symptoms she does not feel that she can manage at home, and with outpatient treatment failure will plan to admit for further IV antibiotics and hydration.     Sharman Cheek, MD 05/13/22 1930

## 2022-05-13 NOTE — ED Provider Notes (Addendum)
[facility] Provider Note    Event Date/Time   First MD Initiated Contact with Patient 05/13/22 1318     (approximate)   History   Cough and Emesis   HPI  Dawn Stein is a 51 y.o. female  who comes in with cough.  Reports history of prior strokes on aspirin Plavix.  She reports having noncontributory lungs problems and  on antibiotics- However today she reports coughing up blood-tinged sputum.  Contrary to triage note she denies any vomiting or coughing up blood.  She does report a little of a headache as well behind her left eye.  Denies any recent falls.  Denies any abdominal pain.   Physical Exam   Triage Vital Signs: ED Triage Vitals  Enc Vitals Group     BP 05/13/22 1306 (!) 92/50     Pulse Rate 05/13/22 1306 67     Resp 05/13/22 1306 16     Temp 05/13/22 1306 98.6 F (37 C)     Temp Source 05/13/22 1306 Oral     SpO2 05/13/22 1306 97 %     Weight 05/13/22 1310 260 lb (117.9 kg)     Height 05/13/22 1310 5\' 2"  (1.575 m)     Head Circumference --      Peak Flow --      Pain Score 05/13/22 1309 8     Pain Loc --      Pain Edu? --      Excl. in [place]? --     Most recent vital signs: Vitals:   05/13/22 1306  BP: (!) 92/50  Pulse: 67  Resp: 16  Temp: 98.6 F (37 C)  SpO2: 97%     General: Awake, no distress.  CV:  Good peripheral perfusion.  Resp:  Normal effort.  Abd:  No distention.  Other:  No Obvious wheezing noted.  No swelling in legs. Patient seems to be at her baseline neurological status moving her arms and legs well Pupil reaction on the R denies vision changes.   ED Results / Procedures / Treatments   Labs (all labs ordered are listed, but only abnormal results are displayed) Labs Reviewed  BASIC METABOLIC PANEL - Abnormal; Notable for the following components:      Result Value   Chloride 113 (*)    Creatinine, Ser 1.37 (*)    Calcium 7.8 (*)    GFR, Estimated 47 (*)    All other components within  normal limits  CBC - Abnormal; Notable for the following components:   WBC 2.4 (*)    RBC 5.19 (*)    Platelets 76 (*)    All other components within normal limits  D-DIMER, QUANTITATIVE - Abnormal; Notable for the following components:   D-Dimer, Quant 0.72 (*)    All other components within normal limits  RESP PANEL BY RT-PCR (FLU A&B, COVID) [id number]  PROCALCITONIN  PROTIME-INR  TYPE AND SCREEN  TYPE AND SCREEN  TROPONIN I (HIGH SENSITIVITY)  TROPONIN I (HIGH SENSITIVITY)     EKG  My interpretation of EKG:  Normal sinus rate 60 elevation, T wave inversions in 3 and V3, normal intervals  RADIOLOGY I have reviewed the xray personally and interpreted the concern for multifocal pneumonia  PROCEDURES:  Critical Care performed: No  Procedures   MEDICATIONS ORDERED IN ED: Medications  sodium chloride 0.9 % bolus 1,000 mL (1,000 mLs Intravenous New Bag/Given 05/13/22 1354)     IMPRESSION / MDM / ASSESSMENT  AND PLAN / ED COURSE  I reviewed the triage vital signs and the nursing notes.   Patient's presentation is most consistent with acute presentation with potential threat to life or bodily function.   Patient comes in with concern for coughing up blood.  Work-up done to evaluate for PE, COVID, pneumonia, ACS.  Labs show normal coags.  BMP shows slightly elevated creatinine but similar to her priors.  CBC shows low white count and low platelets although patient does report that she donates plasma.  Troponin negative COVID-negative D-dimer is elevated procalcitonin was negative.  Patient will be handed off to oncoming team pending CT imaging and further work-up and evaluation  X-ray is concerning for possible multifocal pneumonia but her D-dimer is negative so I think we should get some more imaging with a CT scan before deciding on final disposition.  Patient was given some fluids for some low blood pressures.  Patient be handed off to oncoming team pending CT and final  disposition  The patient is on the cardiac monitor to evaluate for evidence of arrhythmia and/or significant heart rate changes.      FINAL CLINICAL IMPRESSION(S) / ED DIAGNOSES   Final diagnoses:  Acute cough     Rx / DC Orders   ED Discharge Orders     None        Note:  This document was prepared using Dragon voice recognition software and may include unintentional dictation errors.   Concha Se, MD 05/13/22 1532    Concha Se, MD 05/13/22 1534

## 2022-05-13 NOTE — Assessment & Plan Note (Addendum)
History of multiple CVAs Patient complained of a headache CT head nonacute, showing multiple stable old infarcts Continue to monitor for worsening given history of antiphospholipid syndrome and mild per prior strokes Tylenol as needed Continue asa, plavix and atorvastatin Neurologic checks

## 2022-05-13 NOTE — Assessment & Plan Note (Signed)
Renal function at baseline 

## 2022-05-13 NOTE — Hospital Course (Signed)
Multifocal pneumonia with hemoptysis, thrombocytopenia multifocal pneumonia with hemoptysis, thrombocytopenia

## 2022-05-13 NOTE — Assessment & Plan Note (Signed)
History of recurrent CVAs without residual deficits Continue aspirin and Plavix and statins

## 2022-05-14 LAB — CBC
HCT: 39 % (ref 36.0–46.0)
Hemoglobin: 12.4 g/dL (ref 12.0–15.0)
MCH: 26 pg (ref 26.0–34.0)
MCHC: 31.8 g/dL (ref 30.0–36.0)
MCV: 81.8 fL (ref 80.0–100.0)
Platelets: 72 10*3/uL — ABNORMAL LOW (ref 150–400)
RBC: 4.77 MIL/uL (ref 3.87–5.11)
RDW: 15.9 % — ABNORMAL HIGH (ref 11.5–15.5)
WBC: 2.9 10*3/uL — ABNORMAL LOW (ref 4.0–10.5)
nRBC: 0 % (ref 0.0–0.2)

## 2022-05-14 LAB — BASIC METABOLIC PANEL
Anion gap: 5 (ref 5–15)
BUN: 11 mg/dL (ref 6–20)
CO2: 22 mmol/L (ref 22–32)
Calcium: 7 mg/dL — ABNORMAL LOW (ref 8.9–10.3)
Chloride: 116 mmol/L — ABNORMAL HIGH (ref 98–111)
Creatinine, Ser: 1.22 mg/dL — ABNORMAL HIGH (ref 0.44–1.00)
GFR, Estimated: 54 mL/min — ABNORMAL LOW (ref >60)
Glucose, Bld: 98 mg/dL (ref 70–99)
Potassium: 3.9 mmol/L (ref 3.5–5.1)
Sodium: 143 mmol/L (ref 135–145)

## 2022-05-14 LAB — RESPIRATORY PANEL BY PCR

## 2022-05-14 LAB — HIV ANTIBODY (ROUTINE TESTING W REFLEX): HIV Screen 4th Generation wRfx: NONREACTIVE

## 2022-05-14 LAB — TYPE AND SCREEN
ABO/RH(D): O POS
Antibody Screen: NEGATIVE

## 2022-05-14 LAB — BRAIN NATRIURETIC PEPTIDE: B Natriuretic Peptide: 68.9 pg/mL (ref 0.0–100.0)

## 2022-05-14 LAB — PROCALCITONIN: Procalcitonin: <0.1 ng/mL

## 2022-05-14 MED ORDER — HYDRALAZINE HCL 20 MG/ML IJ SOLN: 10.0000 mg | INTRAMUSCULAR | Status: DC | PRN

## 2022-05-14 MED ORDER — SENNOSIDES-DOCUSATE SODIUM 8.6-50 MG PO TABS: 1.0000 | ORAL_TABLET | Freq: Every evening | ORAL | Status: DC | PRN

## 2022-05-14 MED ORDER — IPRATROPIUM-ALBUTEROL 0.5-2.5 (3) MG/3ML IN SOLN
3.0000 mL | Freq: Four times a day (QID) | RESPIRATORY_TRACT | Status: DC
  Administered 2022-05-14 (×3): 3 mL via RESPIRATORY_TRACT
  Filled 2022-05-14 (×3): qty 3

## 2022-05-14 MED ORDER — IPRATROPIUM-ALBUTEROL 0.5-2.5 (3) MG/3ML IN SOLN
3.0000 mL | Freq: Three times a day (TID) | RESPIRATORY_TRACT | Status: DC
  Administered 2022-05-15: 3 mL via RESPIRATORY_TRACT
  Filled 2022-05-14: qty 3

## 2022-05-14 MED ORDER — IPRATROPIUM-ALBUTEROL 0.5-2.5 (3) MG/3ML IN SOLN: 3.0000 mL | RESPIRATORY_TRACT | Status: DC | PRN

## 2022-05-14 MED ORDER — AMLODIPINE BESYLATE 5 MG PO TABS
5.0000 mg | ORAL_TABLET | Freq: Every day | ORAL | Status: DC
  Administered 2022-05-14 – 2022-05-15 (×2): 5 mg via ORAL
  Filled 2022-05-14 (×2): qty 1

## 2022-05-14 MED ORDER — TRAZODONE HCL 50 MG PO TABS: 50.0000 mg | ORAL_TABLET | Freq: Every evening | ORAL | Status: DC | PRN

## 2022-05-14 MED ORDER — GUAIFENESIN 100 MG/5ML PO LIQD: 5.0000 mL | ORAL | Status: DC | PRN

## 2022-05-14 MED ORDER — METOPROLOL TARTRATE 5 MG/5ML IV SOLN: 5.0000 mg | INTRAVENOUS | Status: DC | PRN

## 2022-05-14 NOTE — Evaluation (Signed)
Occupational Therapy Evaluation Patient Details Name: Dawn Stein MRN: [id number] DOB: [birth year] Today's Date: 05/14/2022   History of Present Illness Dawn Stein is a 51 y.o. female with medical history significant for Class III obesity, CKD 3A, depression, seizure disorder, antiphospholipid syndrome with multiple CVAs without residual deficits, seizure disorder, ITP in 2018 in the setting of acute stroke, and with hospitalization in 2021 with multifocal pneumonia who presents to the ED with a 2-week history of coughing associated with mild shortness of breath and without chest pain, fever or chills.  She started having intermittent nonbloody nonbilious vomiting in the past several days and over the past 2 days has had blood-tinged sputum. She saw her PCP on 11/9 and was started on Z-Pak without much improvement in symptoms, thus presenting to the ED. Patient has had headaches during this time without visual disturbance or one-sided numbness weakness or tingling.   Clinical Impression   Prior to admission, pt is independent with ADLs, receives assistance from son PRN for IADLs, and independent with functional mobility without an AD. Pt currently functioning at Mod I for bed mobility, Mod I for functional transfers, and supervision for functional mobility in the hallway without an AD. Pt is currently set up-supervision for ADL tasks. Pt is close to baseline level of function with ADLs, however, OT will continue to follow pt while in hospital to prevent further decline and work on implementing energy conservation techniques during self-care tasks. Anticipate pt to make necessary progress with ADL tasks while in hospital to not require formal f/u therapy at D/C. No follow up OT needs recommended at this time.   Recommendations for follow up therapy are one component of a multi-disciplinary discharge planning process, led by the attending physician.  Recommendations may be updated  based on patient status, additional functional criteria and insurance authorization.   Follow Up Recommendations  No OT follow up     Assistance Recommended at Discharge PRN  Patient can return home with the following Assistance with cooking/housework;Assist for transportation    Functional Status Assessment  Patient has had a recent decline in their functional status and demonstrates the ability to make significant improvements in function in a reasonable and predictable amount of time.  Equipment Recommendations  None recommended by OT    Recommendations for Other Services       Precautions / Restrictions Precautions Precautions: Fall Restrictions Weight Bearing Restrictions: No      Mobility Bed Mobility Overal bed mobility: Modified Independent             General bed mobility comments: for supine <> sit    Transfers Overall transfer level: Modified independent Equipment used: None               General transfer comment: STS from EOB      Balance Overall balance assessment: Needs assistance Sitting-balance support: Feet supported Sitting balance-Leahy Scale: Good     Standing balance support: No upper extremity supported Standing balance-Leahy Scale: Good                             ADL either performed or assessed with clinical judgement   ADL Overall ADL's : Needs assistance/impaired                     Lower Body Dressing: Set up;Sitting/lateral leans       Toileting- Clothing Manipulation and Hygiene: Supervision/safety;Sit to/from stand  Functional mobility during ADLs: Supervision/safety (no AD, in the hallway to the nurse's station)       Vision Baseline Vision/History: 1 Wears glasses (reading) Patient Visual Report: No change from baseline       Perception     Praxis      Pertinent Vitals/Pain Pain Assessment Pain Assessment: No/denies pain     Hand Dominance     Extremity/Trunk  Assessment Upper Extremity Assessment Upper Extremity Assessment: Overall WFL for tasks assessed   Lower Extremity Assessment Lower Extremity Assessment: Overall WFL for tasks assessed       Communication Communication Communication: No difficulties   Cognition Arousal/Alertness: Awake/alert Behavior During Therapy: WFL for tasks assessed/performed Overall Cognitive Status: Within Functional Limits for tasks assessed                                       General Comments       Exercises Other Exercises Other Exercises: OT provided education re: role of OT, OT POC, post acute recs, sitting up for all meals, EOB/OOB mobility with assistance, home/fall safety.     Shoulder Instructions      Home Living Family/patient expects to be discharged to:: Private residence Living Arrangements: Children Available Help at Discharge: Family;Available PRN/intermittently Type of Home: Apartment Home Access: Level entry     Home Layout: One level     Bathroom Shower/Tub: Chief Strategy Officer: Standard     Home Equipment: Cane - single point   Additional Comments: Pt disabled (not working), son recently lost job      Prior Functioning/Environment Prior Level of Function : Independent/Modified Independent             Mobility Comments: Independent without an AD, denies history of falls ADLs Comments: Independent with ADLs, pt not driving. Son provides transportation, assists with cooking        OT Problem List: Decreased activity tolerance;Cardiopulmonary status limiting activity;Impaired balance (sitting and/or standing)      OT Treatment/Interventions: Self-care/ADL training;Therapeutic exercise;Therapeutic activities;Energy conservation;Patient/family education;Balance training;DME and/or AE instruction    OT Goals(Current goals can be found in the care plan section) Acute Rehab OT Goals Patient Stated Goal: go home OT Goal Formulation:  With patient Time For Goal Achievement: 05/28/22 Potential to Achieve Goals: Good   OT Frequency: Min 2X/week    Co-evaluation              AM-PAC OT "6 Clicks" Daily Activity     Outcome Measure Help from another person eating meals?: None Help from another person taking care of personal grooming?: A Little Help from another person toileting, which includes using toliet, bedpan, or urinal?: A Little Help from another person bathing (including washing, rinsing, drying)?: A Little Help from another person to put on and taking off regular upper body clothing?: None Help from another person to put on and taking off regular lower body clothing?: A Little 6 Click Score: 20   End of Session Nurse Communication: Mobility status  Activity Tolerance: Patient tolerated treatment well Patient left: in bed;with call bell/phone within reach;with bed alarm set  OT Visit Diagnosis: Unsteadiness on feet (R26.81)                Time: 2542-7062 OT Time Calculation (min): 16 min Charges:  OT General Charges $OT Visit: 1 Visit OT Evaluation $OT Eval Low Complexity: 1 Low  Gerrie Nordmann  MS, OTR/L ascom [phone]  05/14/22, 3:52 PM

## 2022-05-14 NOTE — Evaluation (Signed)
Physical Therapy Evaluation Patient Details Name: Dawn Stein MRN: [id number] DOB: [birth year] Today's Date: 05/14/2022  History of Present Illness  Pt is a 51 year old with history of class III obesity, CKD stage IIIa, depression, seizures, antiphospholipid syndrome with multiple CVA without residual deficit, ITP in 2018 came to the ED with complaints of 2 weeks of cough, and shortness of breath. MD assessment includes: Multifocal pneumonia with hemoptysis, viral pneumonia/bronchitis, hypoxia, thrombocytopenia, HA, leukopenia, and seizure disorder.   Clinical Impression  Pt was pleasant and motivated to participate during the session and put forth good effort throughout. Pt required no physical assistance with any functional task and demonstrated very good control and stability throughout the session.  Pt easily ambulated 200 feet conversing the entire time without and AD and with no adverse symptoms.  Pt's SpO2 on room air was in the mid 90s at rest and dropped temporarily to a low of 91% after ambulation but quickly increased back to >/= 92%.  Pt reported feeling that she is at her functional baseline with her only goal being to return home as soon as possible.  Pt reported feeling no need for further PT services.  Will complete PT orders at this time but will reassess pt pending a change in status upon receipt of new PT orders.         Recommendations for follow up therapy are one component of a multi-disciplinary discharge planning process, led by the attending physician.  Recommendations may be updated based on patient status, additional functional criteria and insurance authorization.  Follow Up Recommendations No PT follow up      Assistance Recommended at Discharge None  Patient can return home with the following  Assist for transportation    Equipment Recommendations None recommended by PT  Recommendations for Other Services       Functional Status Assessment Patient  has not had a recent decline in their functional status     Precautions / Restrictions Precautions Precautions: None (Fall score 6) Restrictions Weight Bearing Restrictions: No      Mobility  Bed Mobility Overal bed mobility: Independent                  Transfers Overall transfer level: Independent                 General transfer comment: Very good eccentric and concentric control and stability    Ambulation/Gait Ambulation/Gait assistance: Independent Gait Distance (Feet): 200 Feet Assistive device: None Gait Pattern/deviations: WFL(Within Functional Limits) Gait velocity: WNL     General Gait Details: Pt steady with amb including with start/stops and head turns without an AD  Stairs            Wheelchair Mobility    Modified Rankin (Stroke Patients Only)       Balance Overall balance assessment: No apparent balance deficits (not formally assessed)                                           Pertinent Vitals/Pain Pain Assessment Pain Assessment: No/denies pain    Home Living Family/patient expects to be discharged to:: Private residence Living Arrangements: Children Available Help at Discharge: Family;Available PRN/intermittently Type of Home: Apartment Home Access: Level entry       Home Layout: One level Home Equipment: Cane - single point Additional Comments: Pt disabled (not working), son recently lost job  Prior Function Prior Level of Function : Independent/Modified Independent             Mobility Comments: Ind amb community distances without an AD, no fall history ADLs Comments: Independent with ADLs, pt not driving. Son provides transportation, assists with cooking.     Hand Dominance        Extremity/Trunk Assessment   Upper Extremity Assessment Upper Extremity Assessment: Overall WFL for tasks assessed    Lower Extremity Assessment Lower Extremity Assessment: Overall WFL for tasks  assessed       Communication   Communication: No difficulties  Cognition Arousal/Alertness: Awake/alert Behavior During Therapy: WFL for tasks assessed/performed Overall Cognitive Status: Within Functional Limits for tasks assessed                                          General Comments      Exercises     Assessment/Plan    PT Assessment Patient does not need any further PT services  PT Problem List         PT Treatment Interventions      PT Goals (Current goals can be found in the Care Plan section)  Acute Rehab PT Goals PT Goal Formulation: All assessment and education complete, DC therapy    Frequency       Co-evaluation               AM-PAC PT "6 Clicks" Mobility  Outcome Measure Help needed turning from your back to your side while in a flat bed without using bedrails?: None Help needed moving from lying on your back to sitting on the side of a flat bed without using bedrails?: None Help needed moving to and from a bed to a chair (including a wheelchair)?: None Help needed standing up from a chair using your arms (e.g., wheelchair or bedside chair)?: None Help needed to walk in hospital room?: None Help needed climbing 3-5 steps with a railing? : None 6 Click Score: 24    End of Session   Activity Tolerance: Patient tolerated treatment well Patient left: in chair;with call bell/phone within reach Nurse Communication: Mobility status PT Visit Diagnosis: Difficulty in walking, not elsewhere classified (R26.2)    Time: [id number] PT Time Calculation (min) (ACUTE ONLY): 21 min   Charges:   PT Evaluation $PT Eval Low Complexity: 1 Low         D. Royetta Asal PT, DPT 05/14/22, 5:15 PM

## 2022-05-14 NOTE — ED Notes (Signed)
Pt ambulates to the restroom with  steady gait.

## 2022-05-14 NOTE — ED Notes (Signed)
Pt provided with x2 warm blankets and apple juice per request. Pt denies further needs. VSS, call light is in reach, NAD at this time. WCTM.

## 2022-05-14 NOTE — ED Notes (Signed)
Meal tray to bedside by dietary. Pt positioned in bed to eat. WCTM.

## 2022-05-14 NOTE — Progress Notes (Signed)
PROGRESS NOTE    Dawn Stein  NWG:[id number] DOB: [birth year] DOA: 05/13/2022 PCP: Barbette Reichmann, MD   Brief Narrative:  51 year old with history of class III obesity, CKD stage IIIa, depression, seizures, antiphospholipid syndrome with multiple CVA without residual deficit, ITP in 2018 comes to the ED with complaints of 2 weeks of cough, and shortness of breath.  She was seen by PCP on 11/9 and was prescribed Z-Pak without much improvement.  In the ER she was noted to have borderline low blood pressure, thrombocytopenia, negative procalcitonin.  CTA chest was negative for PE but question of multifocal pneumonia/groundglass opacity, CT of the head showed old infarcts but no acute pathology.  Respiratory panel was positive for metapneumovirus.   Assessment & Plan:  Principal Problem:   Multifocal pneumonia with hemoptysis Active Problems:   Hypoxia   History of ITP 2018   Hypotension   Headache   Leukopenia   History of Recurrent cerebrovascular accidents (CVAs) (HCC)   Seizure disorder (HCC)   Depression   CKD (chronic kidney disease), stage IIIa   Obesity, Class III, BMI 40-49.9 (morbid obesity) (HCC)   History of stroke without residual deficits   Antiphospholipid antibody syndrome (HCC)   Thrombocytopenia (HCC)     Assessment and Plan: * Multifocal pneumonia with hemoptysis, viral pneumonia/bronchitis Hypoxia There is no obvious evidence of bacterial pneumonia at this time, procalcitonin & BNP is negative.  Respiratory panel is positive for metapneumovirus.   We will continue bronchodilator treatment, I-S/flutter valve, supportive care.  No need for antibiotics at this time.  CTA chest showing groundglass opacity which I suspect is either from viral bronchitis versus fluid overload.  Would recommend repeating CT chest in about 3 tmonths  History of ITP 2018 Thrombocytopenia Due to previous history of ITP we will need to closely monitor platelet level.  EDP  spoke with Dr. Orlie Dakin who recommended routine management for now.  I suspect acute bone marrow suppression/thrombocytopenia secondary to underlying infection.  We will continue to monitor.   Headache History of multiple CVAs Does have history of previous CVA.  Continue aspirin, Plavix and statin.  History of antiphospholipid syndrome  Leukopenia In the setting of acute infection Antiphospholipid antibody syndrome (HCC) History of recurrent CVAs without residual deficits Continue aspirin and Plavix and statins  Obesity, Class III, BMI 40-49.9 (morbid obesity) (HCC) Potential complicating factor to overall prognosis and care  CKD (chronic kidney disease), stage IIIa Renal function at baseline  Depression Continue venlafaxine  Seizure disorder (HCC) Continue lamotrigine and Depakote     DVT prophylaxis: SCDs Start: 05/13/22 2021 Code Status: Full code Family Communication:    Status is: Inpatient Maintain hospital stay for pulmonary symptoms and monitoring of platelets   Subjective: Still has some hemoptysis and feeling weak. No other complaints.    Examination:  General exam: Appears calm and comfortable  Respiratory system: Mild rhonchi at the bases.  Cardiovascular system: S1 & S2 heard, RRR. No JVD, murmurs, rubs, gallops or clicks. No pedal edema. Gastrointestinal system: Abdomen is nondistended, soft and nontender. No organomegaly or masses felt. Normal bowel sounds heard. Central nervous system: Alert and oriented. No focal neurological deficits. Extremities: Symmetric 5 x 5 power. Skin: No rashes, lesions or ulcers Psychiatry: Judgement and insight appear normal. Mood & affect appropriate.     Objective: Vitals:   05/14/22 0326 05/14/22 0541 05/14/22 0700 05/14/22 0706  BP: (!) 105/53 (!) 114/56 123/75   Pulse: 67 75 68 70  Resp: 18 18 (!) 25 (!)  23  Temp:  98.5 F (36.9 C)    TempSrc:  Oral    SpO2: 99% 98% 91% 93%  Weight:      Height:         Intake/Output Summary (Last 24 hours) at 05/14/2022 0844 Last data filed at 05/14/2022 0540 Gross per 24 hour  Intake 1000 ml  Output --  Net 1000 ml   Filed Weights   05/13/22 1310  Weight: 117.9 kg     Data Reviewed:   CBC: Recent Labs  Lab 05/13/22 1331 05/14/22 0625  WBC 2.4* 2.9*  HGB 13.6 12.4  HCT 42.4 39.0  MCV 81.7 81.8  PLT 76* 72*   Basic Metabolic Panel: Recent Labs  Lab 05/13/22 1331 05/14/22 0625  NA 143 143  K 3.6 3.9  CL 113* 116*  CO2 25 22  GLUCOSE 91 98  BUN 14 11  CREATININE 1.37* 1.22*  CALCIUM 7.8* 7.0*   GFR: Estimated Creatinine Clearance: 67.2 mL/min (A) (by C-G formula based on SCr of 1.22 mg/dL (H)). Liver Function Tests: No results for input(s): "AST", "ALT", "ALKPHOS", "BILITOT", "PROT", "ALBUMIN" in the last 168 hours. No results for input(s): "LIPASE", "AMYLASE" in the last 168 hours. No results for input(s): "AMMONIA" in the last 168 hours. Coagulation Profile: Recent Labs  Lab 05/13/22 1331  INR 1.0   Cardiac Enzymes: No results for input(s): "CKTOTAL", "CKMB", "CKMBINDEX", "TROPONINI" in the last 168 hours. BNP (last 3 results) No results for input(s): "PROBNP" in the last 8760 hours. HbA1C: No results for input(s): "HGBA1C" in the last 72 hours. CBG: No results for input(s): "GLUCAP" in the last 168 hours. Lipid Profile: No results for input(s): "CHOL", "HDL", "LDLCALC", "TRIG", "CHOLHDL", "LDLDIRECT" in the last 72 hours. Thyroid Function Tests: No results for input(s): "TSH", "T4TOTAL", "FREET4", "T3FREE", "THYROIDAB" in the last 72 hours. Anemia Panel: No results for input(s): "VITAMINB12", "FOLATE", "FERRITIN", "TIBC", "IRON", "RETICCTPCT" in the last 72 hours. Sepsis Labs: Recent Labs  Lab 05/13/22 1331 05/14/22 0625  PROCALCITON <0.10 <0.10    Recent Results (from the past 240 hour(s))  Resp Panel by RT-PCR (Flu A&B, Covid) Anterior Nasal Swab     Status: None   Collection Time: 05/13/22  1:31  PM   Specimen: Anterior Nasal Swab  Result Value Ref Range Status   SARS Coronavirus 2 by RT PCR NEGATIVE NEGATIVE Final    Comment: (NOTE) SARS-CoV-2 target nucleic acids are NOT DETECTED.  The SARS-CoV-2 RNA is generally detectable in upper respiratory specimens during the acute phase of infection. The lowest concentration of SARS-CoV-2 viral copies this assay can detect is 138 copies/mL. A negative result does not preclude SARS-Cov-2 infection and should not be used as the sole basis for treatment or other patient management decisions. A negative result may occur with  improper specimen collection/handling, submission of specimen other than nasopharyngeal swab, presence of viral mutation(s) within the areas targeted by this assay, and inadequate number of viral copies(<138 copies/mL). A negative result must be combined with clinical observations, patient history, and epidemiological information. The expected result is Negative.  Fact Sheet for Patients:  BloggerCourse.comhttps://www.fda.gov/media/152166/download  Fact Sheet for Healthcare Providers:  SeriousBroker.ithttps://www.fda.gov/media/152162/download  This test is no t yet approved or cleared by the [place] FDA and  has been authorized for detection and/or diagnosis of SARS-CoV-2 by FDA under an Emergency Use Authorization (EUA). This EUA will remain  in effect (meaning this test can be used) for the duration of the COVID-19 declaration under Section 564(b)(1)  of the Act, 21 U.S.C.section 360bbb-3(b)(1), unless the authorization is terminated  or revoked sooner.       Influenza A by PCR NEGATIVE NEGATIVE Final   Influenza B by PCR NEGATIVE NEGATIVE Final    Comment: (NOTE) The Xpert Xpress SARS-CoV-2/FLU/RSV plus assay is intended as an aid in the diagnosis of influenza from Nasopharyngeal swab specimens and should not be used as a sole basis for treatment. Nasal washings and aspirates are unacceptable for Xpert Xpress  SARS-CoV-2/FLU/RSV testing.  Fact Sheet for Patients: BloggerCourse.com  Fact Sheet for Healthcare Providers: SeriousBroker.it  This test is not yet approved or cleared by the [place] FDA and has been authorized for detection and/or diagnosis of SARS-CoV-2 by FDA under an Emergency Use Authorization (EUA). This EUA will remain in effect (meaning this test can be used) for the duration of the COVID-19 declaration under Section 564(b)(1) of the Act, 21 U.S.C. section 360bbb-3(b)(1), unless the authorization is terminated or revoked.  Performed at [facility], [address] [place], [place] [address]   Respiratory (~20 pathogens) panel by PCR     Status: Abnormal   Collection Time: 05/13/22  9:17 PM   Specimen: Nasopharyngeal Swab; Respiratory  Result Value Ref Range Status   Adenovirus NOT DETECTED NOT DETECTED Final   Coronavirus 229E NOT DETECTED NOT DETECTED Final    Comment: (NOTE) The Coronavirus on the Respiratory Panel, DOES NOT test for the novel  Coronavirus (2019 nCoV)    Coronavirus HKU1 NOT DETECTED NOT DETECTED Final   Coronavirus NL63 NOT DETECTED NOT DETECTED Final   Coronavirus OC43 NOT DETECTED NOT DETECTED Final   Metapneumovirus DETECTED (A) NOT DETECTED Final   Rhinovirus / Enterovirus NOT DETECTED NOT DETECTED Final   Influenza A NOT DETECTED NOT DETECTED Final   Influenza B NOT DETECTED NOT DETECTED Final   Parainfluenza Virus 1 NOT DETECTED NOT DETECTED Final   Parainfluenza Virus 2 NOT DETECTED NOT DETECTED Final   Parainfluenza Virus 3 NOT DETECTED NOT DETECTED Final   Parainfluenza Virus 4 NOT DETECTED NOT DETECTED Final   Respiratory Syncytial Virus NOT DETECTED NOT DETECTED Final   Bordetella pertussis NOT DETECTED NOT DETECTED Final   Bordetella Parapertussis NOT DETECTED NOT DETECTED Final   Chlamydophila pneumoniae NOT DETECTED NOT DETECTED Final   Mycoplasma pneumoniae NOT  DETECTED NOT DETECTED Final    Comment: Performed at [facility], [address], [place], [place] [address]         Radiology Studies: CT Angio Chest PE W and/or Wo Contrast  Result Date: 05/13/2022 CLINICAL DATA:  Cough. Patient on antibiotics. Coughing up blood tinged sputum today. EXAM: CT ANGIOGRAPHY CHEST WITH CONTRAST TECHNIQUE: Multidetector CT imaging of the chest was performed using the standard protocol during bolus administration of intravenous contrast. Multiplanar CT image reconstructions and MIPs were obtained to evaluate the vascular anatomy. RADIATION DOSE REDUCTION: This exam was performed according to the departmental dose-optimization program which includes automated exposure control, adjustment of the mA and/or kV according to patient size and/or use of iterative reconstruction technique. CONTRAST:  108mL OMNIPAQUE IOHEXOL 350 MG/ML SOLN COMPARISON:  Current chest radiographs. FINDINGS: Cardiovascular: Pulmonary arteries are well opacified. There is no evidence of a pulmonary embolism. Heart top-normal in size. No pericardial effusion. Coronary arteries are unremarkable. Great vessels are normal in caliber. Minimal enhancement of the aorta. No convincing dissection. No significant atherosclerosis. Mediastinum/Nodes: No neck base, mediastinal or hilar masses. Mildly enlarged right subcarinal lymph node, 1.7 cm short axis.  Prominent right hilar lymph nodes. Trachea and esophagus are unremarkable. Lungs/Pleura: Numerous ill-defined small, predominantly ground-glass, nodular opacities throughout the right lung. Minimal linear subsegmental atelectasis at the base of the left lower lobe. Remainder of the left lung is clear. No pleural effusion.  No pneumothorax. Upper Abdomen: Multiple gallstones. No evidence of acute cholecystitis. No acute findings in the visualized upper abdomen. Musculoskeletal: No fracture or acute finding. No bone lesion. No chest wall mass. Review of the MIP  images confirms the above findings. IMPRESSION: 1. No evidence of a pulmonary embolism. 2. Numerous ill-defined small nodular opacities throughout the right lung, mostly ground-glass, consistent with multifocal infection with an atypical etiology suspected. Recommend follow-up CT in 2-3 months to document improvement/resolution. Electronically Signed   By: Amie Portland M.D.   On: 05/13/2022 16:45   CT HEAD WO CONTRAST ( )  Result Date: 05/13/2022 CLINICAL DATA:  Sudden severe headache. History of strokes. Patient taking aspirin/Plavix. EXAM: CT HEAD WITHOUT CONTRAST TECHNIQUE: Contiguous axial images were obtained from the base of the skull through the vertex without intravenous contrast. RADIATION DOSE REDUCTION: This exam was performed according to the departmental dose-optimization program which includes automated exposure control, adjustment of the mA and/or kV according to patient size and/or use of iterative reconstruction technique. COMPARISON:  02/05/2021. FINDINGS: Brain: No evidence of acute infarction, hemorrhage, hydrocephalus, extra-axial collection or mass lesion/mass effect. Old bilateral frontal lobe, right parietooccipital lobe and left occipital lobe infarcts. Vascular: No hyperdense vessel or unexpected calcification. Skull: Normal. Negative for fracture or focal lesion. Sinuses/Orbits: Globes and orbits are unremarkable. Scattered mild to moderate bilateral ethmoid, anterior sphenoid and bilateral maxillary sinus mucosal thickening. Other: None. IMPRESSION: 1. No acute intracranial abnormalities. 2. Multiple old infarcts that are stable compared to the prior head CT. Electronically Signed   By: Amie Portland M.D.   On: 05/13/2022 16:39   DG Chest 2 View  Result Date: 05/13/2022 CLINICAL DATA:  Central chest discomfort.  Cough for 2 weeks. EXAM: CHEST - 2 VIEW COMPARISON:  None Available. FINDINGS: The heart size and mediastinal contours are within normal limits. Bilateral lower lobe  hazy lung opacities, right worse than the left concerning for multifocal pneumonia. IMPRESSION: Bilateral lower lobe hazy lung opacities, right worse than the left concerning for multifocal pneumonia. Follow-up to resolution is recommended. Electronically Signed   By: Larose Hires D.O.   On: 05/13/2022 14:03        Scheduled Meds:  aspirin EC  81 mg Oral Daily   atorvastatin  10 mg Oral Daily   clopidogrel  75 mg Oral Daily   divalproex  250 mg Oral Daily   ipratropium-albuterol  3 mL Nebulization Q6H   lamoTRIgine  100 mg Oral Daily   lamoTRIgine  200 mg Oral QHS   venlafaxine XR  75 mg Oral Daily   Continuous Infusions:  azithromycin     cefTRIAXone (ROCEPHIN)  IV       LOS: 1 day   Time spent= 35 mins    Kaira Stringfield Joline Maxcy, MD [facility]  If 7PM-7AM, please contact night-coverage  05/14/2022, 8:44 AM

## 2022-05-14 NOTE — Progress Notes (Signed)
Paperwork left with staff, pt was resting at time of attempted visit. Will leave note for follow up to go over paperwork with pt.

## 2022-05-15 LAB — LEGIONELLA PNEUMOPHILA SEROGP 1 UR AG: L. pneumophila Serogp 1 Ur Ag: NEGATIVE

## 2022-05-15 LAB — BASIC METABOLIC PANEL
Anion gap: 6 (ref 5–15)
BUN: 16 mg/dL (ref 6–20)
CO2: 23 mmol/L (ref 22–32)
Calcium: 7.6 mg/dL — ABNORMAL LOW (ref 8.9–10.3)
Chloride: 115 mmol/L — ABNORMAL HIGH (ref 98–111)
Creatinine, Ser: 1.18 mg/dL — ABNORMAL HIGH (ref 0.44–1.00)
GFR, Estimated: 56 mL/min — ABNORMAL LOW (ref >60)
Glucose, Bld: 88 mg/dL (ref 70–99)
Potassium: 3.7 mmol/L (ref 3.5–5.1)
Sodium: 144 mmol/L (ref 135–145)

## 2022-05-15 LAB — CBC
HCT: 37.9 % (ref 36.0–46.0)
Hemoglobin: 12.3 g/dL (ref 12.0–15.0)
MCH: 26.4 pg (ref 26.0–34.0)
MCHC: 32.5 g/dL (ref 30.0–36.0)
MCV: 81.3 fL (ref 80.0–100.0)
Platelets: 89 10*3/uL — ABNORMAL LOW (ref 150–400)
RBC: 4.66 MIL/uL (ref 3.87–5.11)
RDW: 15.7 % — ABNORMAL HIGH (ref 11.5–15.5)
WBC: 3.9 10*3/uL — ABNORMAL LOW (ref 4.0–10.5)
nRBC: 0 % (ref 0.0–0.2)

## 2022-05-15 LAB — MAGNESIUM: Magnesium: 2.1 mg/dL (ref 1.7–2.4)

## 2022-05-15 LAB — PROCALCITONIN: Procalcitonin: <0.1 ng/mL

## 2022-05-15 MED ORDER — ALBUTEROL SULFATE HFA 108 (90 BASE) MCG/ACT IN AERS: 2.0000 | INHALATION_SPRAY | Freq: Four times a day (QID) | RESPIRATORY_TRACT | 1 refills | Status: AC | PRN

## 2022-05-15 MED ORDER — IPRATROPIUM-ALBUTEROL 0.5-2.5 (3) MG/3ML IN SOLN: 3.0000 mL | Freq: Two times a day (BID) | RESPIRATORY_TRACT | Status: DC

## 2022-05-15 MED ORDER — POTASSIUM CHLORIDE CRYS ER 20 MEQ PO TBCR
20.0000 meq | EXTENDED_RELEASE_TABLET | Freq: Once | ORAL | Status: AC
  Administered 2022-05-15: 20 meq via ORAL
  Filled 2022-05-15: qty 1

## 2022-05-15 NOTE — Progress Notes (Signed)
Occupational Therapy Treatment Patient Details Name: Dawn Stein MRN: [id number] DOB: 19-Jul-1970 Today's Date: 05/15/2022   History of present illness Pt is a 51 year old with history of class III obesity, CKD stage IIIa, depression, seizures, antiphospholipid syndrome with multiple CVA without residual deficit, ITP in 2018 came to the ED with complaints of 2 weeks of cough, and shortness of breath. MD assessment includes: Multifocal pneumonia with hemoptysis, viral pneumonia/bronchitis, hypoxia, thrombocytopenia, HA, leukopenia, and seizure disorder.   OT comments  Chart reviewed, pt agreeable to OT tx session. Tx session targeted providing education re: energy conservation techniques to facilitate safe ADL completion. Pt provided hand out, demo with good carry over and no further questions. Pt performed amb to bathroom indep, LB dressing with MOD I, grooming tasks with MOD I (slightly increased time throughout). No further OT needs identified at this time, OT will sign off. Please re consult if there is a change in functional status.    Recommendations for follow up therapy are one component of a multi-disciplinary discharge planning process, led by the attending physician.  Recommendations may be updated based on patient status, additional functional criteria and insurance authorization.    Follow Up Recommendations  No OT follow up     Assistance Recommended at Discharge PRN  Patient can return home with the following  Assistance with cooking/housework;Assist for transportation   Equipment Recommendations  None recommended by OT    Recommendations for Other Services      Precautions / Restrictions Precautions Precautions: None Restrictions Weight Bearing Restrictions: No       Mobility Bed Mobility               General bed mobility comments: NT,in recliner pre/post session    Transfers Overall transfer level: Independent                        Balance Overall balance assessment: No apparent balance deficits (not formally assessed)                                         ADL either performed or assessed with clinical judgement   ADL Overall ADL's : Needs assistance/impaired                                       General ADL Comments: LB dressing with MOD I, grooming with MOD I standing    Extremity/Trunk Assessment              Vision       Perception     Praxis      Cognition Arousal/Alertness: Awake/alert Behavior During Therapy: WFL for tasks assessed/performed Overall Cognitive Status: Within Functional Limits for tasks assessed                                          Exercises Other Exercises Other Exercises: provided hand out re: energy conservation techniques, pt with good carry over and reports no further questions    Shoulder Instructions       General Comments      Pertinent Vitals/ Pain       Pain Assessment Pain Assessment: No/denies pain  Home Living  Prior Functioning/Environment              Frequency           Progress Toward Goals  OT Goals(current goals can now be found in the care plan section)  Progress towards OT goals: Progressing toward goals;Goals met/education completed, patient discharged from [facility] goals met and education completed, patient discharged from [facility] OT "6 Clicks" Daily Activity     Outcome Measure   Help from another person eating meals?: None Help from another person taking care of personal grooming?: None Help from another person toileting, which includes using toliet, bedpan, or urinal?: None Help from another person bathing (including washing, rinsing, drying)?: None Help from another person to put on and taking off regular upper body clothing?: None Help from  another person to put on and taking off regular lower body clothing?: None 6 Click Score: 24    End of Session        Activity Tolerance Patient tolerated treatment well   Patient Left in chair;with call bell/phone within reach   Nurse Communication Mobility status        Time: 4175-3010 OT Time Calculation (min): 12 min  Charges: OT General Charges $OT Visit: 1 Visit OT Treatments $Self Care/Home Management : 8-22 mins  Shanon Payor, OTD OTR/L  05/15/22, 10:21 AM

## 2022-05-15 NOTE — Discharge Summary (Signed)
Physician Discharge Summary  Shivon Hackel VOJ:[id number] DOB: 1970-09-02 DOA: 05/13/2022  PCP: Barbette Reichmann, MD  Admit date: 05/13/2022 Discharge date: 05/15/2022  Admitted From: Home Disposition: Home  Recommendations for Outpatient Follow-up:  Follow up with PCP in 1-2 weeks Please obtain BMP/CBC in one week your next doctors visit.  Advised supportive care at home.  As needed bronchodilator inhaler has been prescribed Recommend repeat CT chest to reevaluate groundglass opacity with PCP in about 3-4 months  Home Health: None Equipment/Devices: None Discharge Condition: Stable CODE STATUS: Full code Diet recommendation: Heart healthy  Brief/Interim Summary: 51 year old with history of class III obesity, CKD stage IIIa, depression, seizures, antiphospholipid syndrome with multiple CVA without residual deficit, ITP in 2018 comes to the ED with complaints of 2 weeks of cough, and shortness of breath.  She was seen by PCP on 11/9 and was prescribed Z-Pak without much improvement.  In the ER she was noted to have borderline low blood pressure, thrombocytopenia, negative procalcitonin.  CTA chest was negative for PE but question of multifocal pneumonia/groundglass opacity, CT of the head showed old infarcts but no acute pathology.  Respiratory panel was positive for metapneumovirus.  Over the course of 24 hours in the hospital patient did significantly well with supportive treatment.  On the day of discharge she was ambulating without any issues and wanting to go home.  Today she is medically stable for discharge with recommendations as stated above.     Assessment & Plan:  Principal Problem:   Multifocal pneumonia with hemoptysis Active Problems:   Hypoxia   History of ITP 2018   Hypotension   Headache   Leukopenia   History of Recurrent cerebrovascular accidents (CVAs) (HCC)   Seizure disorder (HCC)   Depression   CKD (chronic kidney disease), stage IIIa   Obesity,  Class III, BMI 40-49.9 (morbid obesity) (HCC)   History of stroke without residual deficits   Antiphospholipid antibody syndrome (HCC)   Thrombocytopenia (HCC)       Assessment and Plan: * Multifocal pneumonia with hemoptysis, viral pneumonia/bronchitis Hypoxia, resolved There is no obvious evidence of bacterial pneumonia at this time, procalcitonin & BNP is negative.  Respiratory panel is positive for metapneumovirus.  Currently doing significantly well therefore we will discharge her with supportive care, as needed bronchodilator inhaler.  Supportive care recommendations have been given.  CTA chest showing groundglass opacity which I suspect is either from viral bronchitis versus fluid overload.  Would recommend repeating CT chest in about 3 tmonths   History of ITP 2018 Thrombocytopenia Due to previous history of ITP we will need to closely monitor platelet level.  EDP spoke with Dr. Orlie Dakin who recommended routine management for now.  I suspect acute bone marrow suppression/thrombocytopenia secondary to underlying infection.  We will continue to monitor.     Headache History of multiple CVAs Does have history of previous CVA.  Continue aspirin, Plavix and statin.  History of antiphospholipid syndrome   Leukopenia In the setting of acute infection Antiphospholipid antibody syndrome (HCC) History of recurrent CVAs without residual deficits Continue aspirin and Plavix and statins   Obesity, Class III, BMI 40-49.9 (morbid obesity) (HCC) Potential complicating factor to overall prognosis and care   CKD (chronic kidney disease), stage IIIa Renal function at baseline   Depression Continue venlafaxine   Seizure disorder (HCC) Continue lamotrigine and Depakote        Discharge Diagnoses:  Principal Problem:   Multifocal pneumonia with hemoptysis Active Problems:   Hypoxia  History of ITP 2018   Hypotension   Headache   Leukopenia   History of Recurrent  cerebrovascular accidents (CVAs) (HCC)   Seizure disorder (HCC)   Depression   CKD (chronic kidney disease), stage IIIa   Obesity, Class III, BMI 40-49.9 (morbid obesity) (HCC)   History of stroke without residual deficits   Antiphospholipid antibody syndrome (HCC)   Thrombocytopenia (HCC)      Consultations: None  Subjective: Feels great she does not have any complaints today.  She wants to go home.  Discharge Exam: Vitals:   05/15/22 0756 05/15/22 0857  BP:  127/76  Pulse:  73  Resp:  19  Temp:  98.6 F (37 C)  SpO2: 93% 95%   Vitals:   05/14/22 2119 05/15/22 0522 05/15/22 0756 05/15/22 0857  BP: (!) 117/52 103/63  127/76  Pulse: 81 72  73  Resp: Temp: 98.6 F (37 C) 98.5 F (36.9 C)  98.6 F (37 C)  TempSrc:      SpO2: 97% 97% 93% 95%  Weight:      Height:        General: Pt is alert, awake, not in acute distress Cardiovascular: RRR, S1/S2 +, no rubs, no gallops Respiratory: CTA bilaterally, no wheezing, no rhonchi Abdominal: Soft, NT, ND, bowel sounds + Extremities: no edema, no cyanosis  Discharge Instructions   Allergies as of 05/15/2022       Reactions   Tetracyclines & Related Anaphylaxis   Facial swelling        Medication List     STOP taking these medications    azithromycin 250 MG tablet Commonly known as: ZITHROMAX   cefUROXime 250 MG tablet Commonly known as: CEFTIN       TAKE these medications    albuterol 108 (90 Base) MCG/ACT inhaler Commonly known as: VENTOLIN HFA Inhale 2 puffs into the lungs every 6 (six) hours as needed for wheezing or shortness of breath.   amLODipine 5 MG tablet Commonly known as: NORVASC Take 5 mg by mouth daily.   aspirin EC 81 MG tablet Take 81 mg by mouth daily.   atorvastatin 10 MG tablet Commonly known as: LIPITOR Take 10 mg by mouth daily.   clopidogrel 75 MG tablet Commonly known as: PLAVIX Take 75 mg by mouth daily.   divalproex 250 MG DR tablet Commonly known  as: DEPAKOTE Take 250 mg by mouth daily.   lamoTRIgine 100 MG tablet Commonly known as: LAMICTAL Take 100 mg by mouth daily.   lamoTRIgine 200 MG tablet Commonly known as: LAMICTAL Take 200 mg by mouth every evening.   venlafaxine XR 75 MG 24 hr capsule Commonly known as: EFFEXOR-XR Take 75 mg by mouth daily.        Follow-up Information     Barbette Reichmann, MD Follow up in 1 week(s).   Specialty: Internal Medicine Why: Patient to make own follow up appt no answer at office Contact information: [address] [place] [address] [phone]                Allergies  Allergen Reactions   Tetracyclines & Related Anaphylaxis    Facial swelling    You were cared for by a hospitalist during your hospital stay. If you have any questions about your discharge medications or the care you received while you were in the hospital after you are discharged, you can call the unit and asked to speak with the  hospitalist on call if the hospitalist that took care of you is not available. Once you are discharged, your primary care physician will handle any further medical issues. Please note that no refills for any discharge medications will be authorized once you are discharged, as it is imperative that you return to your primary care physician (or establish a relationship with a primary care physician if you do not have one) for your aftercare needs so that they can reassess your need for medications and monitor your lab values.   Procedures/Studies: CT Angio Chest PE W and/or Wo Contrast  Result Date: 05/13/2022 CLINICAL DATA:  Cough. Patient on antibiotics. Coughing up blood tinged sputum today. EXAM: CT ANGIOGRAPHY CHEST WITH CONTRAST TECHNIQUE: Multidetector CT imaging of the chest was performed using the standard protocol during bolus administration of intravenous contrast. Multiplanar CT image reconstructions and MIPs were obtained to evaluate  the vascular anatomy. RADIATION DOSE REDUCTION: This exam was performed according to the departmental dose-optimization program which includes automated exposure control, adjustment of the mA and/or kV according to patient size and/or use of iterative reconstruction technique. CONTRAST:  68mL OMNIPAQUE IOHEXOL 350 MG/ML SOLN COMPARISON:  Current chest radiographs. FINDINGS: Cardiovascular: Pulmonary arteries are well opacified. There is no evidence of a pulmonary embolism. Heart top-normal in size. No pericardial effusion. Coronary arteries are unremarkable. Great vessels are normal in caliber. Minimal enhancement of the aorta. No convincing dissection. No significant atherosclerosis. Mediastinum/Nodes: No neck base, mediastinal or hilar masses. Mildly enlarged right subcarinal lymph node, 1.7 cm short axis. Prominent right hilar lymph nodes. Trachea and esophagus are unremarkable. Lungs/Pleura: Numerous ill-defined small, predominantly ground-glass, nodular opacities throughout the right lung. Minimal linear subsegmental atelectasis at the base of the left lower lobe. Remainder of the left lung is clear. No pleural effusion.  No pneumothorax. Upper Abdomen: Multiple gallstones. No evidence of acute cholecystitis. No acute findings in the visualized upper abdomen. Musculoskeletal: No fracture or acute finding. No bone lesion. No chest wall mass. Review of the MIP images confirms the above findings. IMPRESSION: 1. No evidence of a pulmonary embolism. 2. Numerous ill-defined small nodular opacities throughout the right lung, mostly ground-glass, consistent with multifocal infection with an atypical etiology suspected. Recommend follow-up CT in 2-3 months to document improvement/resolution. Electronically Signed   By: Amie Portland M.D.   On: 05/13/2022 16:45   CT HEAD WO CONTRAST ( )  Result Date: 05/13/2022 CLINICAL DATA:  Sudden severe headache. History of strokes. Patient taking aspirin/Plavix. EXAM: CT HEAD  WITHOUT CONTRAST TECHNIQUE: Contiguous axial images were obtained from the base of the skull through the vertex without intravenous contrast. RADIATION DOSE REDUCTION: This exam was performed according to the departmental dose-optimization program which includes automated exposure control, adjustment of the mA and/or kV according to patient size and/or use of iterative reconstruction technique. COMPARISON:  02/05/2021. FINDINGS: Brain: No evidence of acute infarction, hemorrhage, hydrocephalus, extra-axial collection or mass lesion/mass effect. Old bilateral frontal lobe, right parietooccipital lobe and left occipital lobe infarcts. Vascular: No hyperdense vessel or unexpected calcification. Skull: Normal. Negative for fracture or focal lesion. Sinuses/Orbits: Globes and orbits are unremarkable. Scattered mild to moderate bilateral ethmoid, anterior sphenoid and bilateral maxillary sinus mucosal thickening. Other: None. IMPRESSION: 1. No acute intracranial abnormalities. 2. Multiple old infarcts that are stable compared to the prior head CT. Electronically Signed   By: Amie Portland M.D.   On: 05/13/2022 16:39   DG Chest 2 View  Result Date: 05/13/2022 CLINICAL DATA:  Central chest discomfort.  Cough for 2 weeks. EXAM: CHEST - 2 VIEW COMPARISON:  None Available. FINDINGS: The heart size and mediastinal contours are within normal limits. Bilateral lower lobe hazy lung opacities, right worse than the left concerning for multifocal pneumonia. IMPRESSION: Bilateral lower lobe hazy lung opacities, right worse than the left concerning for multifocal pneumonia. Follow-up to resolution is recommended. Electronically Signed   By: Larose Hires D.O.   On: 05/13/2022 14:03     The results of significant diagnostics from this hospitalization (including imaging, microbiology, ancillary and laboratory) are listed below for reference.     Microbiology: Recent Results (from the past 240 hour(s))  Resp Panel by RT-PCR  (Flu A&B, Covid) Anterior Nasal Swab     Status: None   Collection Time: 05/13/22  1:31 PM   Specimen: Anterior Nasal Swab  Result Value Ref Range Status   SARS Coronavirus 2 by RT PCR NEGATIVE NEGATIVE Final    Comment: (NOTE) SARS-CoV-2 target nucleic acids are NOT DETECTED.  The SARS-CoV-2 RNA is generally detectable in upper respiratory specimens during the acute phase of infection. The lowest concentration of SARS-CoV-2 viral copies this assay can detect is 138 copies/mL. A negative result does not preclude SARS-Cov-2 infection and should not be used as the sole basis for treatment or other patient management decisions. A negative result may occur with  improper specimen collection/handling, submission of specimen other than nasopharyngeal swab, presence of viral mutation(s) within the areas targeted by this assay, and inadequate number of viral copies(<138 copies/mL). A negative result must be combined with clinical observations, patient history, and epidemiological information. The expected result is Negative.  Fact Sheet for Patients:  BloggerCourse.com  Fact Sheet for Healthcare Providers:  SeriousBroker.it  This test is no t yet approved or cleared by the [place] FDA and  has been authorized for detection and/or diagnosis of SARS-CoV-2 by FDA under an Emergency Use Authorization (EUA). This EUA will remain  in effect (meaning this test can be used) for the duration of the COVID-19 declaration under Section 564(b)(1) of the Act, 21 U.S.C.section 360bbb-3(b)(1), unless the authorization is terminated  or revoked sooner.       Influenza A by PCR NEGATIVE NEGATIVE Final   Influenza B by PCR NEGATIVE NEGATIVE Final    Comment: (NOTE) The Xpert Xpress SARS-CoV-2/FLU/RSV plus assay is intended as an aid in the diagnosis of influenza from Nasopharyngeal swab specimens and should not be used as a sole basis for  treatment. Nasal washings and aspirates are unacceptable for Xpert Xpress SARS-CoV-2/FLU/RSV testing.  Fact Sheet for Patients: BloggerCourse.com  Fact Sheet for Healthcare Providers: SeriousBroker.it  This test is not yet approved or cleared by the [place] FDA and has been authorized for detection and/or diagnosis of SARS-CoV-2 by FDA under an Emergency Use Authorization (EUA). This EUA will remain in effect (meaning this test can be used) for the duration of the COVID-19 declaration under Section 564(b)(1) of the Act, 21 U.S.C. section 360bbb-3(b)(1), unless the authorization is terminated or revoked.  Performed at [facility], [address] [place], [place] [address]   Respiratory (~20 pathogens) panel by PCR     Status: Abnormal   Collection Time: 05/13/22  9:17 PM   Specimen: Nasopharyngeal Swab; Respiratory  Result Value Ref Range Status   Adenovirus NOT DETECTED NOT DETECTED Final   Coronavirus 229E NOT DETECTED NOT DETECTED Final    Comment: (NOTE) The Coronavirus on the Respiratory Panel, DOES NOT test for the novel  Coronavirus (  2019 nCoV)    Coronavirus HKU1 NOT DETECTED NOT DETECTED Final   Coronavirus NL63 NOT DETECTED NOT DETECTED Final   Coronavirus OC43 NOT DETECTED NOT DETECTED Final   Metapneumovirus DETECTED (A) NOT DETECTED Final   Rhinovirus / Enterovirus NOT DETECTED NOT DETECTED Final   Influenza A NOT DETECTED NOT DETECTED Final   Influenza B NOT DETECTED NOT DETECTED Final   Parainfluenza Virus 1 NOT DETECTED NOT DETECTED Final   Parainfluenza Virus 2 NOT DETECTED NOT DETECTED Final   Parainfluenza Virus 3 NOT DETECTED NOT DETECTED Final   Parainfluenza Virus 4 NOT DETECTED NOT DETECTED Final   Respiratory Syncytial Virus NOT DETECTED NOT DETECTED Final   Bordetella pertussis NOT DETECTED NOT DETECTED Final   Bordetella Parapertussis NOT DETECTED NOT DETECTED Final   Chlamydophila  pneumoniae NOT DETECTED NOT DETECTED Final   Mycoplasma pneumoniae NOT DETECTED NOT DETECTED Final    Comment: Performed at [facility], [address] [place], [place] [id number]     Labs: BNP (last 3 results) Recent Labs    05/14/22 0540  BNP 68.9   Basic Metabolic Panel: Recent Labs  Lab 05/13/22 1331 05/14/22 0625 05/15/22 0356  NA 143 143 144  K 3.6 3.9 3.7  CL 113* 116* 115*  CO2 25 22 23   GLUCOSE 91 98 88  BUN 14 11 16   CREATININE 1.37* 1.22* 1.18*  CALCIUM 7.8* 7.0* 7.6*  MG  --   --  2.1   Liver Function Tests: No results for input(s): "AST", "ALT", "ALKPHOS", "BILITOT", "PROT", "ALBUMIN" in the last 168 hours. No results for input(s): "LIPASE", "AMYLASE" in the last 168 hours. No results for input(s): "AMMONIA" in the last 168 hours. CBC: Recent Labs  Lab 05/13/22 1331 05/14/22 0625 05/15/22 0356  WBC 2.4* 2.9* 3.9*  HGB 13.6 12.4 12.3  HCT 42.4 39.0 37.9  MCV 81.7 81.8 81.3  PLT 76* 72* 89*   Cardiac Enzymes: No results for input(s): "CKTOTAL", "CKMB", "CKMBINDEX", "TROPONINI" in the last 168 hours. BNP: Invalid input(s): "POCBNP" CBG: No results for input(s): "GLUCAP" in the last 168 hours. D-Dimer Recent Labs    05/13/22 1331  DDIMER 0.72*   Hgb A1c No results for input(s): "HGBA1C" in the last 72 hours. Lipid Profile No results for input(s): "CHOL", "HDL", "LDLCALC", "TRIG", "CHOLHDL", "LDLDIRECT" in the last 72 hours. Thyroid function studies No results for input(s): "TSH", "T4TOTAL", "T3FREE", "THYROIDAB" in the last 72 hours.  Invalid input(s): "FREET3" Anemia work up No results for input(s): "VITAMINB12", "FOLATE", "FERRITIN", "TIBC", "IRON", "RETICCTPCT" in the last 72 hours. Urinalysis    Component Value Date/Time   COLORURINE YELLOW (A) 04/08/2020 1230   APPEARANCEUR HAZY (A) 04/08/2020 1230   APPEARANCEUR Clear 02/01/2014 2340   LABSPEC 1.016 04/08/2020 1230   LABSPEC 1.012 02/01/2014 2340   PHURINE 7.0 04/08/2020  1230   GLUCOSEU NEGATIVE 04/08/2020 1230   GLUCOSEU Negative 02/01/2014 2340   HGBUR NEGATIVE 04/08/2020 1230   BILIRUBINUR NEGATIVE 04/08/2020 1230   BILIRUBINUR Negative 02/01/2014 2340   KETONESUR 5 (A) 04/08/2020 1230   PROTEINUR NEGATIVE 04/08/2020 1230   NITRITE NEGATIVE 04/08/2020 1230   LEUKOCYTESUR NEGATIVE 04/08/2020 1230   LEUKOCYTESUR Negative 02/01/2014 2340   Sepsis Labs Recent Labs  Lab 05/13/22 1331 05/14/22 0625 05/15/22 0356  WBC 2.4* 2.9* 3.9*   Microbiology Recent Results (from the past 240 hour(s))  Resp Panel by RT-PCR (Flu A&B, Covid) Anterior Nasal Swab     Status: None   Collection Time: 05/13/22  1:31 PM   Specimen: Anterior Nasal Swab  Result Value Ref Range Status   SARS Coronavirus 2 by RT PCR NEGATIVE NEGATIVE Final    Comment: (NOTE) SARS-CoV-2 target nucleic acids are NOT DETECTED.  The SARS-CoV-2 RNA is generally detectable in upper respiratory specimens during the acute phase of infection. The lowest concentration of SARS-CoV-2 viral copies this assay can detect is 138 copies/mL. A negative result does not preclude SARS-Cov-2 infection and should not be used as the sole basis for treatment or other patient management decisions. A negative result may occur with  improper specimen collection/handling, submission of specimen other than nasopharyngeal swab, presence of viral mutation(s) within the areas targeted by this assay, and inadequate number of viral copies(<138 copies/mL). A negative result must be combined with clinical observations, patient history, and epidemiological information. The expected result is Negative.  Fact Sheet for Patients:  BloggerCourse.com  Fact Sheet for Healthcare Providers:  SeriousBroker.it  This test is no t yet approved or cleared by the [place] FDA and  has been authorized for detection and/or diagnosis of SARS-CoV-2 by FDA under an Emergency Use  Authorization (EUA). This EUA will remain  in effect (meaning this test can be used) for the duration of the COVID-19 declaration under Section 564(b)(1) of the Act, 21 U.S.C.section 360bbb-3(b)(1), unless the authorization is terminated  or revoked sooner.       Influenza A by PCR NEGATIVE NEGATIVE Final   Influenza B by PCR NEGATIVE NEGATIVE Final    Comment: (NOTE) The Xpert Xpress SARS-CoV-2/FLU/RSV plus assay is intended as an aid in the diagnosis of influenza from Nasopharyngeal swab specimens and should not be used as a sole basis for treatment. Nasal washings and aspirates are unacceptable for Xpert Xpress SARS-CoV-2/FLU/RSV testing.  Fact Sheet for Patients: BloggerCourse.com  Fact Sheet for Healthcare Providers: SeriousBroker.it  This test is not yet approved or cleared by the [place] FDA and has been authorized for detection and/or diagnosis of SARS-CoV-2 by FDA under an Emergency Use Authorization (EUA). This EUA will remain in effect (meaning this test can be used) for the duration of the COVID-19 declaration under Section 564(b)(1) of the Act, 21 U.S.C. section 360bbb-3(b)(1), unless the authorization is terminated or revoked.  Performed at [facility], [address] [place], [place] [address]   Respiratory (~20 pathogens) panel by PCR     Status: Abnormal   Collection Time: 05/13/22  9:17 PM   Specimen: Nasopharyngeal Swab; Respiratory  Result Value Ref Range Status   Adenovirus NOT DETECTED NOT DETECTED Final   Coronavirus 229E NOT DETECTED NOT DETECTED Final    Comment: (NOTE) The Coronavirus on the Respiratory Panel, DOES NOT test for the novel  Coronavirus (2019 nCoV)    Coronavirus HKU1 NOT DETECTED NOT DETECTED Final   Coronavirus NL63 NOT DETECTED NOT DETECTED Final   Coronavirus OC43 NOT DETECTED NOT DETECTED Final   Metapneumovirus DETECTED (A) NOT DETECTED Final   Rhinovirus /  Enterovirus NOT DETECTED NOT DETECTED Final   Influenza A NOT DETECTED NOT DETECTED Final   Influenza B NOT DETECTED NOT DETECTED Final   Parainfluenza Virus 1 NOT DETECTED NOT DETECTED Final   Parainfluenza Virus 2 NOT DETECTED NOT DETECTED Final   Parainfluenza Virus 3 NOT DETECTED NOT DETECTED Final   Parainfluenza Virus 4 NOT DETECTED NOT DETECTED Final   Respiratory Syncytial Virus NOT DETECTED NOT DETECTED Final   Bordetella pertussis NOT DETECTED NOT DETECTED Final   Bordetella Parapertussis NOT DETECTED NOT  DETECTED Final   Chlamydophila pneumoniae NOT DETECTED NOT DETECTED Final   Mycoplasma pneumoniae NOT DETECTED NOT DETECTED Final    Comment: Performed at [facility], [address] [place], [place] [address]     Time coordinating discharge:  I have spent 35 minutes face to face with the patient and on the ward discussing the patients care, assessment, plan and disposition with other care givers. >50% of the time was devoted counseling the patient about the risks and benefits of treatment/Discharge disposition and coordinating care.   SIGNED:   Dimple Nanas, MD  [facility] 05/15/2022, 10:49 AM   If 7PM-7AM, please contact night-coverage

## 2022-10-22 ENCOUNTER — Emergency Department: Payer: 59

## 2022-10-22 ENCOUNTER — Observation Stay: Payer: 59

## 2022-10-22 ENCOUNTER — Observation Stay
Admission: EM | Admit: 2022-10-22 | Discharge: 2022-10-23 | Disposition: A | Discharge status: Home / Self Care | Payer: 59 | Attending: Internal Medicine | Admitting: Internal Medicine

## 2022-10-22 ENCOUNTER — Other Ambulatory Visit: Payer: Self-pay

## 2022-10-22 ENCOUNTER — Emergency Department: Payer: Medicare Other

## 2022-10-22 ENCOUNTER — Other Ambulatory Visit: Payer: Self-pay | Admitting: Neurology

## 2022-10-22 DIAGNOSIS — R29810 Facial weakness: Secondary | ICD-10-CM

## 2022-10-22 DIAGNOSIS — F17210 Nicotine dependence, cigarettes, uncomplicated: Secondary | ICD-10-CM | POA: Diagnosis not present

## 2022-10-22 DIAGNOSIS — I6300 Cerebral infarction due to thrombosis of unspecified precerebral artery: Secondary | ICD-10-CM

## 2022-10-22 DIAGNOSIS — M6281 Muscle weakness (generalized): Secondary | ICD-10-CM | POA: Insufficient documentation

## 2022-10-22 DIAGNOSIS — E669 Obesity, unspecified: Secondary | ICD-10-CM | POA: Insufficient documentation

## 2022-10-22 DIAGNOSIS — Z7982 Long term (current) use of aspirin: Secondary | ICD-10-CM | POA: Insufficient documentation

## 2022-10-22 DIAGNOSIS — Z6841 Body Mass Index (BMI) 40.0 and over, adult: Secondary | ICD-10-CM | POA: Insufficient documentation

## 2022-10-22 DIAGNOSIS — I10 Essential (primary) hypertension: Secondary | ICD-10-CM | POA: Diagnosis present

## 2022-10-22 DIAGNOSIS — F32A Depression, unspecified: Secondary | ICD-10-CM | POA: Diagnosis present

## 2022-10-22 DIAGNOSIS — Z7901 Long term (current) use of anticoagulants: Secondary | ICD-10-CM | POA: Diagnosis not present

## 2022-10-22 DIAGNOSIS — R531 Weakness: Secondary | ICD-10-CM

## 2022-10-22 DIAGNOSIS — N1831 Chronic kidney disease, stage 3a: Secondary | ICD-10-CM | POA: Diagnosis not present

## 2022-10-22 DIAGNOSIS — Z79899 Other long term (current) drug therapy: Secondary | ICD-10-CM | POA: Diagnosis not present

## 2022-10-22 DIAGNOSIS — G459 Transient cerebral ischemic attack, unspecified: Secondary | ICD-10-CM | POA: Diagnosis not present

## 2022-10-22 DIAGNOSIS — G40909 Epilepsy, unspecified, not intractable, without status epilepticus: Secondary | ICD-10-CM

## 2022-10-22 DIAGNOSIS — R4781 Slurred speech: Secondary | ICD-10-CM

## 2022-10-22 DIAGNOSIS — I129 Hypertensive chronic kidney disease with stage 1 through stage 4 chronic kidney disease, or unspecified chronic kidney disease: Secondary | ICD-10-CM | POA: Insufficient documentation

## 2022-10-22 DIAGNOSIS — G4733 Obstructive sleep apnea (adult) (pediatric): Secondary | ICD-10-CM | POA: Diagnosis present

## 2022-10-22 DIAGNOSIS — N183 Chronic kidney disease, stage 3 unspecified: Secondary | ICD-10-CM | POA: Diagnosis present

## 2022-10-22 DIAGNOSIS — I639 Cerebral infarction, unspecified: Secondary | ICD-10-CM | POA: Diagnosis present

## 2022-10-22 DIAGNOSIS — E785 Hyperlipidemia, unspecified: Secondary | ICD-10-CM | POA: Diagnosis present

## 2022-10-22 LAB — URINALYSIS, ROUTINE W REFLEX MICROSCOPIC
Bilirubin Urine: NEGATIVE
Glucose, UA: NEGATIVE mg/dL
Hgb urine dipstick: NEGATIVE
Ketones, ur: NEGATIVE mg/dL
Leukocytes,Ua: NEGATIVE
Nitrite: NEGATIVE
Protein, ur: NEGATIVE mg/dL
Specific Gravity, Urine: 1.024 (ref 1.005–1.030)
pH: 7 (ref 5.0–8.0)

## 2022-10-22 LAB — COMPREHENSIVE METABOLIC PANEL
ALT: 11 U/L (ref 0–44)
AST: 16 U/L (ref 15–41)
Albumin: 3.6 g/dL (ref 3.5–5.0)
Alkaline Phosphatase: 73 U/L (ref 38–126)
Anion gap: 9 (ref 5–15)
BUN: 24 mg/dL — ABNORMAL HIGH (ref 6–20)
CO2: 23 mmol/L (ref 22–32)
Calcium: 8.8 mg/dL — ABNORMAL LOW (ref 8.9–10.3)
Chloride: 109 mmol/L (ref 98–111)
Creatinine, Ser: 1.38 mg/dL — ABNORMAL HIGH (ref 0.44–1.00)
GFR, Estimated: 46 mL/min — ABNORMAL LOW (ref >60)
Glucose, Bld: 77 mg/dL (ref 70–99)
Potassium: 3.9 mmol/L (ref 3.5–5.1)
Sodium: 141 mmol/L (ref 135–145)
Total Bilirubin: 0.3 mg/dL (ref 0.3–1.2)
Total Protein: 6.4 g/dL — ABNORMAL LOW (ref 6.5–8.1)

## 2022-10-22 LAB — PROTIME-INR
INR: 1 (ref 0.8–1.2)
Prothrombin Time: 12.8 seconds (ref 11.4–15.2)

## 2022-10-22 LAB — HEMOGLOBIN A1C
Hgb A1c MFr Bld: 5.4 % (ref 4.8–5.6)
Mean Plasma Glucose: 108.28 mg/dL

## 2022-10-22 LAB — DIFFERENTIAL
Abs Immature Granulocytes: 0.02 10*3/uL (ref 0.00–0.07)
Basophils Absolute: 0 10*3/uL (ref 0.0–0.1)
Basophils Relative: 1 %
Eosinophils Absolute: 0.1 10*3/uL (ref 0.0–0.5)
Eosinophils Relative: 2 %
Immature Granulocytes: 0 %
Lymphocytes Relative: 20 %
Lymphs Abs: 1.1 10*3/uL (ref 0.7–4.0)
Monocytes Absolute: 0.4 10*3/uL (ref 0.1–1.0)
Monocytes Relative: 8 %
Neutro Abs: 3.7 10*3/uL (ref 1.7–7.7)
Neutrophils Relative %: 69 %

## 2022-10-22 LAB — CBC
HCT: 38.6 % (ref 36.0–46.0)
Hemoglobin: 12.1 g/dL (ref 12.0–15.0)
MCH: 25 pg — ABNORMAL LOW (ref 26.0–34.0)
MCHC: 31.3 g/dL (ref 30.0–36.0)
MCV: 79.8 fL — ABNORMAL LOW (ref 80.0–100.0)
Platelets: 188 10*3/uL (ref 150–400)
RBC: 4.84 MIL/uL (ref 3.87–5.11)
RDW: 17.6 % — ABNORMAL HIGH (ref 11.5–15.5)
WBC: 5.3 10*3/uL (ref 4.0–10.5)
nRBC: 0 % (ref 0.0–0.2)

## 2022-10-22 LAB — ETHANOL: Alcohol, Ethyl (B): <10 mg/dL (ref <10)

## 2022-10-22 LAB — CBG MONITORING, ED: Glucose-Capillary: 88 mg/dL (ref 70–99)

## 2022-10-22 LAB — APTT: aPTT: 40 seconds — ABNORMAL HIGH (ref 24–36)

## 2022-10-22 MED ORDER — ACETAMINOPHEN 650 MG RE SUPP: 650.0000 mg | RECTAL | Status: DC | PRN

## 2022-10-22 MED ORDER — ACETAMINOPHEN 325 MG PO TABS: 650.0000 mg | ORAL_TABLET | ORAL | Status: DC | PRN

## 2022-10-22 MED ORDER — ATORVASTATIN CALCIUM 20 MG PO TABS
10.0000 mg | ORAL_TABLET | Freq: Every day | ORAL | Status: DC
  Administered 2022-10-22 – 2022-10-23 (×2): 10 mg via ORAL
  Filled 2022-10-22 (×2): qty 1

## 2022-10-22 MED ORDER — LAMOTRIGINE 100 MG PO TABS
200.0000 mg | ORAL_TABLET | Freq: Every evening | ORAL | Status: DC
  Administered 2022-10-22: 200 mg via ORAL
  Filled 2022-10-22: qty 2

## 2022-10-22 MED ORDER — DEXTROSE 50 % IV SOLN
25.0000 mL | Freq: Once | INTRAVENOUS | Status: AC
  Administered 2022-10-22: 25 mL via INTRAVENOUS

## 2022-10-22 MED ORDER — STROKE: EARLY STAGES OF RECOVERY BOOK: Freq: Once | Status: AC

## 2022-10-22 MED ORDER — SODIUM CHLORIDE 0.9% FLUSH
3.0000 mL | Freq: Once | INTRAVENOUS | Status: AC
  Administered 2022-10-22: 3 mL via INTRAVENOUS

## 2022-10-22 MED ORDER — ASPIRIN 81 MG PO TBEC
81.0000 mg | DELAYED_RELEASE_TABLET | Freq: Every day | ORAL | Status: DC
  Administered 2022-10-22 – 2022-10-23 (×2): 81 mg via ORAL
  Filled 2022-10-22 (×2): qty 1

## 2022-10-22 MED ORDER — ACETAMINOPHEN 160 MG/5ML PO SOLN: 650.0000 mg | ORAL | Status: DC | PRN

## 2022-10-22 MED ORDER — DEXTROSE 50 % IV SOLN
INTRAVENOUS | Status: AC
  Filled 2022-10-22: qty 50

## 2022-10-22 MED ORDER — ENOXAPARIN SODIUM 60 MG/0.6ML IJ SOSY
0.5000 mg/kg | PREFILLED_SYRINGE | INTRAMUSCULAR | Status: DC
  Administered 2022-10-22: 57.5 mg via SUBCUTANEOUS
  Filled 2022-10-22: qty 0.6

## 2022-10-22 MED ORDER — CLOPIDOGREL BISULFATE 75 MG PO TABS
75.0000 mg | ORAL_TABLET | Freq: Every day | ORAL | Status: DC
  Administered 2022-10-22 – 2022-10-23 (×2): 75 mg via ORAL
  Filled 2022-10-22 (×2): qty 1

## 2022-10-22 MED ORDER — IOHEXOL 350 MG/ML SOLN
100.0000 mL | Freq: Once | INTRAVENOUS | Status: AC | PRN
  Administered 2022-10-22: 100 mL via INTRAVENOUS

## 2022-10-22 MED ORDER — SENNOSIDES-DOCUSATE SODIUM 8.6-50 MG PO TABS: 1.0000 | ORAL_TABLET | Freq: Every evening | ORAL | Status: DC | PRN

## 2022-10-22 NOTE — Assessment & Plan Note (Signed)
-  Continue home statin -Check lipid profile tomorrow

## 2022-10-22 NOTE — Assessment & Plan Note (Signed)
Patient has an history of seizure for many years, last seizure was many year ago.  No acute concern -Continue home Depakote and Lamictal after the med rec

## 2022-10-22 NOTE — ED Notes (Signed)
MRI at the bedside

## 2022-10-22 NOTE — Assessment & Plan Note (Signed)
-   CPAP at night °

## 2022-10-22 NOTE — Assessment & Plan Note (Signed)
Estimated body mass index is 46.53 kg/m as calculated from the following:   Height as of this encounter:  (1.575 m).   Weight as of this encounter: 115.4 kg.   -This will complicate overall prognosis

## 2022-10-22 NOTE — Assessment & Plan Note (Signed)
Patient has an history of multiple strokes in the past, last 1 was few years ago.  No residual deficit. -Continue home aspirin, Plavix and statin

## 2022-10-22 NOTE — Code Documentation (Signed)
Stroke Response Nurse Documentation Code Documentation  Dawn Stein is a 52 y.o. female arriving to [facility] via [facility] on 10/22/2022 with past medical hx of seizures, stroke, and head injury. On aspirin 81 mg daily and clopidogrel 75 mg daily. Code stroke was activated by EMS.   Patient from home where she was LKW at 1340 and now complaining of right sided weakness and aphasia. Patient's son last saw patient at 59. Reports she was cleaning up when she started to feel the room spinning and sat down on the floor. Patient was able to call son who told EMS that she was talking normally and then began not making any sense at which time EMS was called. Patient's CBG 69 on arrival, delay due to difficulty obtaining IV access. 1/2 amp of D50 given prior to transport to CT. EDP Bradler at bedside. Another amp of D50 given at bedside after imaging complete.   Stroke team at the bedside on patient arrival. Labs drawn and patient cleared for CT by Dr. Vicente Males. Patient to CT with team. NIHSS 13, see documentation for details and code stroke times. Patient with left gaze preference , right hemianopia, right facial droop, right arm weakness, right leg weakness, right decreased sensation, Expressive aphasia , and Sensory  neglect on exam. The following imaging was completed:  CT Head and CTA. Patient is not a candidate for IV Thrombolytic due to rapidly resolving symptoms, per MD. Patient is not a candidate for IR due to no LVO seen on imaging, per MD.   Care Plan: q30 min VS and NIHSS until patient is outside window for TNK at  1810.   Bedside handoff with ED RN Gerilyn Pilgrim.    Wille Glaser  Stroke Response RN

## 2022-10-22 NOTE — ED Notes (Signed)
Spoke to charge RN regarding handoff and rec RN not aware of this admission. Charge to speak with rec RN and complete handoff. Transport will be arranged once completed.

## 2022-10-22 NOTE — ED Notes (Signed)
Transport arrival.

## 2022-10-22 NOTE — ED Notes (Signed)
Pt given OJ PO

## 2022-10-22 NOTE — ED Notes (Signed)
Pt provided box meal and fluids.

## 2022-10-22 NOTE — H&P (Addendum)
History and Physical    Patient: Dawn Stein DOB: 06/04/1971 DOA: 10/22/2022 DOS: the patient was seen and examined on 10/22/2022 PCP: Barbette Reichmann, MD  Patient coming from: Home  Chief Complaint:  Chief Complaint  Patient presents with   Code Stroke   HPI: Dawn Stein is a 52 y.o. female with medical history significant of multiple prior CVA, seizure disorder, hypertension and depression came to ED with complaint of sudden onset bilateral lower extremity weakness and persistent dizziness and also having difficulty speaking.  She was last seen normal at 1:40 PM when her son left for work. She called him at 2:50 PM saying she was not feeling well and to call the ambulance.  Patient has an history of multiple strokes in the past.  Only residual deficit is loss of peripheral vision in both eyes.  He had multiple strokes in the past involving bilateral MCA territory and left PCA infarct.  Patient denies any recent illness, headache, fever or chills.  No shortness of breath or chest pain.  No urinary symptoms.  No recent change in appetite or bowel habits.  Her symptoms improved by the time she was seen in ED.  ED course.  Vital stable.  CT head and CTA was negative for any acute abnormalities.  Labs mostly at baseline. EKG.  Personally reviewed with sinus rhythm and no other abnormality.  Teleneurology was consulted and they were advising admission for stroke workup.  No tPA needed.  Review of Systems: As mentioned in the history of present illness. All other systems reviewed and are negative. Past Medical History:  Diagnosis Date   Closed head injury    at age 61   Seizures (HCC)    Stroke [facility])    Past Surgical History:  Procedure Laterality Date   none     Social History:  reports that she has been smoking cigarettes. She has never used smokeless tobacco. She reports current drug use. Drug: Marijuana. She reports that she does not  drink alcohol.  Allergies  Allergen Reactions   Tetracyclines & Related Anaphylaxis    Facial swelling    Family History  Problem Relation Age of Onset   Seizures Mother    Lung cancer Maternal Grandfather    Breast cancer Maternal Aunt     Prior to Admission medications   Medication Sig Start Date End Date Taking? Authorizing Provider  albuterol (VENTOLIN HFA) 108 (90 Base) MCG/ACT inhaler Inhale 2 puffs into the lungs every 6 (six) hours as needed for wheezing or shortness of breath. 05/15/22   Yasmin Dibello, Loura Halt, MD  amLODipine (NORVASC) 5 MG tablet Take 5 mg by mouth daily. 12/17/19   [provider]  aspirin EC 81 MG tablet Take 81 mg by mouth daily.    [provider]  atorvastatin (LIPITOR) 10 MG tablet Take 10 mg by mouth daily.    [provider]  clopidogrel (PLAVIX) 75 MG tablet Take 75 mg by mouth daily. 10/30/19   [provider]  divalproex (DEPAKOTE) 250 MG DR tablet Take 250 mg by mouth daily.     [provider]  lamoTRIgine (LAMICTAL) 100 MG tablet Take 100 mg by mouth daily. 12/17/19   [provider]  lamoTRIgine (LAMICTAL) 200 MG tablet Take 200 mg by mouth every evening. 12/31/19   [provider]  venlafaxine XR (EFFEXOR-XR) 75 MG 24 hr capsule Take 75 mg by mouth daily. 01/27/20   [provider]    Physical  Exam: Vitals:   10/22/22 1644 10/22/22 1700 10/22/22 1702 10/22/22 1730  BP:  115/68  114/69  Pulse: 65 70  62  Resp: 19 16  18   Temp:  98.3 F (36.8 C)    TempSrc:  Oral    SpO2: 99% 100%  100%  Weight:   115.4 kg   Height:   5\' 2"  (1.575 m)     General: Vital signs reviewed.  Patient is well-developed and well-nourished, in no acute distress and cooperative with exam.  Head: Normocephalic and atraumatic. Eyes: EOMI, conjunctivae normal, no scleral icterus.  Neck: Supple, trachea midline, normal ROM, no JVD, masses, thyromegaly, or carotid bruit present.  Cardiovascular: RRR,  S1 normal, S2 normal, no murmurs, gallops, or rubs. Pulmonary/Chest: Clear to auscultation bilaterally, no wheezes, rales, or rhonchi. Abdominal: Soft, non-tender, non-distended, BS +, Extremities: No lower extremity edema bilaterally,  pulses symmetric and intact bilaterally. No cyanosis or clubbing. Neurological: A&O x3, Strength is normal and symmetric bilaterally, cranial nerve II-XII are grossly intact, no focal motor deficit, sensory intact to light touch bilaterally.  Chronic loss of peripheral vision bilaterally. Skin: Warm, dry and intact. No rashes or erythema. Psychiatric: Normal mood and affect.   Data Reviewed: Prior data reviewed as mentioned above  Assessment and Plan: * TIA (transient ischemic attack) Patient presented with bilateral lower extremity weakness and dizziness which has been resolved at the time of interview. CT head and CTA was negative for any acute changes. -Admitted under observation and medical telemetry. -Complete stroke workup which will include MRI, echocardiogram, A1c, lipid panel, PT and OT evaluation -Continue home aspirin, Plavix and statin  History of Recurrent cerebrovascular accidents (CVAs) (HCC) Patient has an history of multiple strokes in the past, last 1 was few years ago.  No residual deficit. -Continue home aspirin, Plavix and statin  Seizure disorder Patient has an history of seizure for many years, last seizure was many year ago.  No acute concern -Continue home Depakote and Lamictal after the med rec  Depression Patient is on Effexor at home-continue after the med rec  CKD (chronic kidney disease), stage IIIa Renal function seems to be at baseline. -Monitor renal function -Avoid nephrotoxins  Benign essential hypertension Currently normotensive. -Continue home amlodipine medication once med rec is completed  Obesity, Class III, BMI 40-49.9 (morbid obesity) Estimated body mass index is 46.53 kg/m as calculated from the  following:   Height as of this encounter: 5\' 2"  (1.575 m).   Weight as of this encounter: 115.4 kg.   -This will complicate overall prognosis  HLD (hyperlipidemia) -Continue home statin -Check lipid profile tomorrow  OSA (obstructive sleep apnea) -CPAP at night    Advance Care Planning:   Code Status: Full Code confirmed with patient and son  Consults: Neurology  Family Communication: Discussed with son at bedside  Severity of Illness: The appropriate patient status for this patient is OBSERVATION. Observation status is judged to be reasonable and necessary in order to provide the required intensity of service to ensure the patient's safety. The patient's presenting symptoms, physical exam findings, and initial radiographic and laboratory data in the context of their medical condition is felt to place them at decreased risk for further clinical deterioration. Furthermore, it is anticipated that the patient will be medically stable for discharge from the hospital within 2 midnights of admission.   This record has been created using Conservation officer, historic buildings. Errors have been sought and corrected,but may not always be located. Such creation errors  do not reflect on the standard of care.   Author: Arnetha Courser, MD 10/22/2022 5:55 PM  For on call review [email or web address].

## 2022-10-22 NOTE — ED Notes (Addendum)
Pt to ED for right sided weakness, right sided hemi-neglect, right sided facial droop. Ems also reports slurred speech.

## 2022-10-22 NOTE — Progress Notes (Addendum)
[facility] Telemedicine Consult   Requesting Provider: Dr Vicente Males Consult Participants: patient, atrium RN Lambert Mody, stroke RN Tresa Endo and Dr Vicente Males Location of the provider: [place] [facility] Location of the patient: [facility] ER  This consult was provided via telemedicine with 2-way video and audio communication. The patient/family was informed that care would be provided in this way and agreed to receive care in this manner.    Chief Complaint: code stroke  HPI: Dawn Stein is a 52 year old African-American lady with past medical history of hypertension, hyperlipidemia, obesity, depression, bipolar disorder, anemia, strokes and seizures.  She was last seen normal at 1:40 PM when her son left for work.  She called him at 2:50 PM saying she was not feeling well and to call the ambulance and having trouble speaking.  She does have a history of multiple prior strokes at least 10 as per the son with 2 stand-alone strokes and 8 episodes of seizures followed by transient neurological worsening.  She follows with Dr. Theora Master at [facility] clinic neurology.  She has had bilateral MCA and left PCA infarct and at baseline has right-sided peripheral vision loss as well as some cognitive impairment and right hand and leg weakness.  She is able to ambulate independently has some memory difficulties as per the son.  Today upon arrival in the ER and evaluated initially by stroke nurse she was found to have some aphasia and right-sided weakness along with some left-sided neglect with NIH stroke scale of 13.  She went for emergent CT scan which I personally reviewed showed no acute abnormality with multiple strokes involving bilateral MCA territory as well as large left PCA infarct.  CT angio also personally reviewed by me and showed no large vessel stenosis or occlusion in bilateral carotid arteries.  Both middle cerebral arteries were patent.  Diminished peripheral filling of the left MCA  branches. Patient neurological exam improved after she came back from CT scan to NIH stroke scale of 5 persistent field cut and mild drift in her extremities.  LKW: 140 pm tpa given?: No, rapidly reversing deficits IR Thrombectomy? No, no LVO Modified Rankin Scale: 3-Moderate disability-requires help but walks WITHOUT assistance Time of teleneurologist evaluation: 1545  Exam: Vitals:   10/22/22 1613 10/22/22 1627  BP: 123/68   Pulse: (!) 58   Resp: 20   Temp:  97.9 F (36.6 C)  SpO2: 100%     General:  Obese 52 year old African-American lady.  Not in distress.  Appears mildly anxious.  1A: Level of Consciousness - 0 1B: Ask Month and Age - 0 1C: 'Blink Eyes' & 'Squeeze Hands' - 0 2: Test Horizontal Extraocular Movements - 0 3: Test Visual Fields - 1 4: Test Facial Palsy - 0 5A: Test Left Arm Motor Drift - 0 5B: Test Right Arm Motor Drift - 0 6A: Test Left Leg Motor Drift - 1 6B: Test Right Leg Motor Drift - 0 7: Test Limb Ataxia - 0 8: Test Sensation - 1 9: Test Language/Aphasia- 1 10: Test Dysarthria - 0 11: Test Extinction/Inattention - 1 NIHSS score: 5  Pleasant 52 year old obese African-American lady not in distress. She is awake alert oriented to time and place.  Mild nonfluent speech with occasional word finding difficulties.  Speech seems clear without dysarthria.  She is able to follow midline and one-step commands well.  Extraocular movements are full range without nystagmus.  Face is symmetric without focal weakness.  Tongue midline.  Motor system exam still had shown  initial bilateral leg and right arm drifts but improved subsequently, only subtle left leg..  Had some diminished sensation on the left side extinction.  Coordination slow but accurate.  Gait not tested.  Imaging Reviewed:  CT head no acute abnormality.  Old bilateral MCA and large left PCA remote age infarcts. CT angiogram no significant LVO.  Diminished peripheral left MCA branches. Labs reviewed  in epic and pertinent values follow: CBC unremarkable.   Assessment: 52 year old African-American lady with history of multiple prior strokes presents with sudden onset of aphasia and right-sided weakness which appears to be improving and not far from her baseline.  CT head and CT angiogram do not show large vessel occlusion.  She may have a small stroke.  Is not a candidate for thrombolysis due to rapidly resolving symptoms  Recommendations: Admit to the medical hospitalist team for close neurological observation and stroke workup.  She remains at risk for neurological worsening, recurrent stroke or stroke extension.  If the patient has neurological worsening and is still within the TNK window may be activated code stroke to reconsider thrombolysis with TNK. Check MRI scan of the brain, echocardiogram, lipid profile, hemoglobin A1c Check swallow eval and is able to swallow continue aspirin and Plavix and aggressive risk factor modification. DVT prophylaxis. Physical occupational and speech therapy consults. Permissive hypertension. Long discussion with the patient as well as with her son whom I spoke to over the phone and discussed risk-benefit of thrombolysis due to rapidly resolving deficits I feel we should hold off on TNK at the present time but however may reconsider deficits worsening. Discussed with Dr. Elige Radon or ER MD     This patient is receiving care for possible acute neurological changes. There was 60 minutes of care by this provider at the time of service, including time for direct evaluation via telemedicine, review of medical records, imaging studies and discussion of findings with providers, the patient and/or family.  Delia Heady MD [facility] [phone]  If 7pm- 7am, please page neurology on call as listed in AMION.

## 2022-10-22 NOTE — ED Notes (Signed)
Pt returned from MRI. Monitor to be reapplied. Reassessments to follow.

## 2022-10-22 NOTE — ED Notes (Signed)
CBG 69

## 2022-10-22 NOTE — ED Notes (Signed)
Called the floor for rec nurse to answer questions and have hand off completed. RN not available and message will be given to her per sec.

## 2022-10-22 NOTE — Progress Notes (Signed)
Chap responded to Code Stroke, Pt being taken to CT Scan at this moment. No family yet. Mirna Mires is available as needed.   10/22/22 1600  Spiritual Encounters  Type of Visit Initial  Care provided to: Pt not available  Conversation partners present during encounter Nurse  Referral source Code page  Reason for visit Code  OnCall Visit No  Interventions  Spiritual Care Interventions Made Other (comment)

## 2022-10-22 NOTE — Hospital Course (Signed)
Dawn Stein is a 52 y.o. female with medical history significant of multiple prior CVA, seizure disorder, hypertension and depression came to ED with complaint of sudden onset bilateral lower extremity weakness and persistent dizziness and also having difficulty speaking.   She was last seen normal at 1:40 PM when her son left for work. She called him at 2:50 PM saying she was not feeling well and to call the ambulance.   Her symptoms improve when seen in ED.  ED course.  Vital stable.  CT head and CTA was negative for any acute abnormalities.  Labs mostly at baseline. EKG.  Personally reviewed with sinus rhythm and no other abnormality.   Teleneurology was consulted and they were advising admission for stroke workup.  No tPA needed.  4/23: Hemodynamically stable.  Lipid panel mostly normal with LDL of 96 which is improved from prior of 137, goal is less than 70.  MRI brain with no acute intracranial process. No specific recommendations from PT and OT.  Echocardiogram with normal EF, grade 2 diastolic dysfunction and LVH.  Patient is at her baseline and no new complaints or deficits. She is being discharged on her home medications.  Patient was already on aspirin, Plavix and statin which she will continue.  Patient need to have a close follow-up with her providers for further recommendations.

## 2022-10-22 NOTE — Assessment & Plan Note (Signed)
Renal function seems to be at baseline. -Monitor renal function -Avoid nephrotoxins

## 2022-10-22 NOTE — ED Notes (Signed)
1551 1/2 amp D50 given for cbg 69 VO MD Bradler. 1553 pt to CT2 w/ teleneuro and RN-ER and RN Stroke coordinator.

## 2022-10-22 NOTE — Assessment & Plan Note (Addendum)
Patient is on Effexor at home-continue after the med rec

## 2022-10-22 NOTE — ED Notes (Signed)
Cbg 88 

## 2022-10-22 NOTE — Assessment & Plan Note (Signed)
Patient presented with bilateral lower extremity weakness and dizziness which has been resolved at the time of interview. CT head and CTA was negative for any acute changes. -Admitted under observation and medical telemetry. -Complete stroke workup which will include MRI, echocardiogram, A1c, lipid panel, PT and OT evaluation -Continue home aspirin, Plavix and statin

## 2022-10-22 NOTE — ED Triage Notes (Signed)
Pt to ED code stroke [facility] from home for stroke sx. LKW 1415.

## 2022-10-22 NOTE — Progress Notes (Signed)
Code stroke activated per elert @ 1543, prealert for EMS patient.  Dr Pearlean Brownie on camera @ [phone] prior to pt arrival. LKWT 1340,noted to have significant deficit on arrival, pt to CT 1552.  Returned to ER From CT @ 1610.  Dr Pearlean Brownie on phone with son to gather history. Repeat FSBG improved from initial (69) to 88 and sx better.  No TNK, however will reactivate at pt worsened.   MRS 1

## 2022-10-22 NOTE — Assessment & Plan Note (Signed)
Currently normotensive. -Continue home amlodipine medication once med rec is completed

## 2022-10-22 NOTE — ED Provider Notes (Signed)
[facility] Provider Note   Event Date/Time   First MD Initiated Contact with Patient 10/22/22 1554     (approximate) History  Code Stroke  HPI Dawn Stein is a 52 y.o. female with a stated past medical history of CVAs, seizure disorder, hyperlipidemia, CKD, and morbid obesity who presents complaining of right-sided weakness, right-sided facial droop, and slurred speech.  Difficult to fully appreciate patient's last known well time however patient was last seen well at approximately 1340 today.  Patient states that around 2:00 she called her son complaining of "not feeling right".  Patient states that she does not feel as if she had a seizure. ROS: Patient currently denies any vision changes, tinnitus, difficulty speaking, facial droop, sore throat, chest pain, shortness of breath, abdominal pain, nausea/vomiting/diarrhea, dysuria   Physical Exam  Triage Vital Signs: ED Triage Vitals  Enc Vitals Group     BP      Pulse      Resp      Temp      Temp src      SpO2      Weight      Height      Head Circumference      Peak Flow      Pain Score      Pain Loc      Pain Edu?      Excl. in GC?    Most recent vital signs: Vitals:   10/22/22 1900 10/22/22 1930  BP: 117/69 121/72  Pulse: 63 (!) 56  Resp: 17 16  Temp:    SpO2: 100% 100%   General: Awake, oriented x4. CV:  Good peripheral perfusion.  Resp:  Normal effort.  Abd:  No distention.  Other:  Initial NIH 11 and throughout evaluation improved to 5: Visual field cut, left leg drift, left leg decree sensation, mild aphasia, and mild inattention Labs (all labs ordered are listed, but only abnormal results are displayed) Labs Reviewed  APTT - Abnormal; Notable for the following components:      Result Value   aPTT 40 (*)    All other components within normal limits  CBC - Abnormal; Notable for the following components:   MCV 79.8 (*)    MCH 25.0 (*)    RDW 17.6 (*)    All other  components within normal limits  COMPREHENSIVE METABOLIC PANEL - Abnormal; Notable for the following components:   BUN 24 (*)    Creatinine, Ser 1.38 (*)    Calcium 8.8 (*)    Total Protein 6.4 (*)    GFR, Estimated 46 (*)    All other components within normal limits  URINALYSIS, ROUTINE W REFLEX MICROSCOPIC - Abnormal; Notable for the following components:   Color, Urine YELLOW (*)    APPearance CLEAR (*)    All other components within normal limits  PROTIME-INR  DIFFERENTIAL  ETHANOL  HEMOGLOBIN A1C  LIPID PANEL  CBG MONITORING, ED   EKG ED ECG REPORT I, Merwyn Katos, the attending physician, personally viewed and interpreted this ECG. Date: 10/22/2022 EKG Time: 1623 Rate: 69 Rhythm: normal sinus rhythm QRS Axis: normal Intervals: normal ST/T Wave abnormalities: normal Narrative Interpretation: no evidence of acute ischemia RADIOLOGY ED MD interpretation: CT of the head without contrast interpreted by me shows no evidence of acute abnormalities including no intracerebral hemorrhage, obvious masses, or significant edema  CT angiography of head and neck interpreted independently by me and shows somewhat limited evaluation  due to bolus timing however within this limitation there is no intracranial large vessel occlusion seen -Agree with radiology assessment Official radiology report(s): MR BRAIN WO CONTRAST  Result Date: 10/22/2022 CLINICAL DATA:  Sudden onset bilateral lower extremity weakness, persistent dizziness, history of prior infarcts EXAM: MRI HEAD WITHOUT CONTRAST TECHNIQUE: Multiplanar, multiecho pulse sequences of the brain and surrounding structures were obtained without intravenous contrast. COMPARISON:  02/05/2021 MRI head FINDINGS: Brain: No restricted diffusion to suggest acute or subacute infarct. No acute hemorrhage, mass, mass effect, or midline shift. No hydrocephalus or extra-axial collection. Empty sella. Normal craniocervical junction. Redemonstrated  remote infarcts in the bilateral frontal and occipital lobes. Advanced cerebral atrophy for age. Remote bilateral cerebellar infarcts. Vascular: Normal arterial flow voids. Skull and upper cervical spine: Normal marrow signal. Sinuses/Orbits: Clear paranasal sinuses. No acute finding in the orbits. Other: The mastoid air cells are well aerated. IMPRESSION: No acute intracranial process. No evidence of acute or subacute infarct. Electronically Signed   By: Wiliam Ke M.D.   On: 10/22/2022 18:55   CT ANGIO HEAD NECK W WO CM  Result Date: 10/22/2022 CLINICAL DATA:  Stroke code, speech changes EXAM: CT ANGIOGRAPHY HEAD AND NECK WITH AND WITHOUT CONTRAST TECHNIQUE: Multidetector CT imaging of the head and neck was performed using the standard protocol during bolus administration of intravenous contrast. Multiplanar CT image reconstructions and MIPs were obtained to evaluate the vascular anatomy. Carotid stenosis measurements (when applicable) are obtained utilizing NASCET criteria, using the distal internal carotid diameter as the denominator. RADIATION DOSE REDUCTION: This exam was performed according to the departmental dose-optimization program which includes automated exposure control, adjustment of the mA and/or kV according to patient size and/or use of iterative reconstruction technique. CONTRAST:  OMNIPAQUE IOHEXOL 350 MG/ML SOLN COMPARISON:  No prior CTA available, correlation is made with MRA head 10/26/2016; correlation is also made with CT head 10/22/2022 FINDINGS: CT HEAD FINDINGS For noncontrast findings, please see same day CT head. CTA NECK FINDINGS Aortic arch: Standard branching. Imaged portion shows no evidence of aneurysm or dissection. No significant stenosis of the major arch vessel origins. Right carotid system: No evidence of stenosis, dissection, or occlusion. Left carotid system: No evidence of stenosis, dissection, or occlusion. Vertebral arteries: No evidence of stenosis,  dissection, or occlusion. Skeleton: No acute osseous abnormality. Degenerative changes in the cervical spine. Other neck: Negative. Upper chest: No focal pulmonary opacity or pleural effusion. Review of the MIP images confirms the above findings CTA HEAD FINDINGS Evaluation is somewhat limited by bolus timing. Anterior circulation: Both internal carotid arteries are patent to the termini, without significant stenosis. A1 segments patent. Normal anterior communicating artery. Anterior cerebral arteries are patent through the A2 and A3 segments, with evaluation of the more distal ACAs somewhat limited by contrast timing. No M1 stenosis or occlusion. MCA branches perfused through the M2 and M3 segments, with evaluation of the more distal MCAs somewhat limited by contrast timing. Posterior circulation: Vertebral arteries patent to the vertebrobasilar junction with mild stenosis in the mid left V4 (series 6, image 138). Posterior inferior cerebellar arteries patent proximally. Basilar patent to its distal aspect without significant stenosis. Superior cerebellar arteries patent proximally. Patent P1 segments. The PCA s are perfused through the P2 segments, with evaluation of the more distal PCA segments limited by bolus timing. The bilateral posterior communicating arteries are not visualized. Venous sinuses: Not well opacified due to phase of contrast. Anatomic variants: None significant. Review of the MIP images confirms the above  findings IMPRESSION: 1. Evaluation of the intracranial vasculature is somewhat limited by bolus timing. Within this limitation, no intracranial large vessel occlusion. Mild stenosis in the mid left V4. 2. No hemodynamically significant stenosis in the neck. Electronically Signed   By: Wiliam Ke M.D.   On: 10/22/2022 16:56   CT HEAD CODE STROKE WO CONTRAST  Result Date: 10/22/2022 CLINICAL DATA:  Code stroke. EXAM: CT HEAD WITHOUT CONTRAST TECHNIQUE: Contiguous axial images were  obtained from the base of the skull through the vertex without intravenous contrast. RADIATION DOSE REDUCTION: This exam was performed according to the departmental dose-optimization program which includes automated exposure control, adjustment of the mA and/or kV according to patient size and/or use of iterative reconstruction technique. COMPARISON:  None Available. FINDINGS: Brain: No evidence of acute large vascular territory infarct, acute hemorrhage, mass lesion, midline shift or hydrocephalus. Similar appearance of multiple remote bilateral frontal lobe, right parieto-occipital and left occipital lobe infarcts. Similar appearance of remote bilateral cerebellar infarcts. Partially empty sella. Vascular: No hyperdense vessel identified. Calcific atherosclerosis. Skull: No acute fracture. Sinuses/Orbits: Clear sinuses. ASPECTS [facility] Stroke Program Early CT Score) total score (0-10 with 10 being normal): 10. IMPRESSION: 1. No evidence of acute large vascular territory infarct or acute hemorrhage. ASPECTS is 10. 2. Multiple old infarcts appear similar to the prior. An MRI could provide more sensitive evaluation for acute infarct if clinically warranted. 3. Partially empty sella, which is often a normal anatomic variant but can be associated with idiopathic intracranial hypertension. Code stroke imaging results were communicated on 10/22/2022 at 4:06 pm to provider Dr. Pearlean Brownie via secure text paging. Electronically Signed   By: Feliberto Harts M.D.   On: 10/22/2022 16:07   PROCEDURES: Critical Care performed: Yes, see critical care procedure note(s) Procedures MEDICATIONS ORDERED IN ED: Medications   stroke: early stages of recovery book (has no administration in time range)  acetaminophen (TYLENOL) tablet 650 mg (has no administration in time range)    Or  acetaminophen (TYLENOL) 160 MG/5ML solution 650 mg (has no administration in time range)    Or  acetaminophen (TYLENOL) suppository 650 mg (has no  administration in time range)  senna-docusate (Senokot-S) tablet 1 tablet (has no administration in time range)  enoxaparin (LOVENOX) injection 57.5 mg (has no administration in time range)  atorvastatin (LIPITOR) tablet 10 mg (has no administration in time range)  aspirin EC tablet 81 mg (has no administration in time range)  clopidogrel (PLAVIX) tablet 75 mg (has no administration in time range)  lamoTRIgine (LAMICTAL) tablet 200 mg (has no administration in time range)  sodium chloride flush (NS) 0.9 % injection 3 mL (3 mLs Intravenous Given 10/22/22 1615)  iohexol (OMNIPAQUE) 350 MG/ML injection 100 mL (100 mLs Intravenous Contrast Given 10/22/22 1602)  dextrose 50 % solution 25 mL ( Intravenous Not Given 10/22/22 1707)  dextrose 50 % solution 25 mL (25 mLs Intravenous Given 10/22/22 1620)   IMPRESSION / MDM / ASSESSMENT AND PLAN / ED COURSE  I reviewed the triage vital signs and the nursing notes.              The patient is on the cardiac monitor to evaluate for evidence of arrhythmia and/or significant heart rate changes. Patient's presentation is most consistent with acute presentation with potential threat to life or bodily function. Stroke alert PMH risk factors: Previous CVA, hypercholesterolemia Neurologic Deficits: Right-sided weakness, dysarthria, facial droop Last known Well Time: 1340 NIH Stroke Score: 5 Given History and Exam I have  lower suspicion for infectious etiology, neurologic changes secondary to toxicologic ingestion, seizure, complex migraine. Presentation concerning for possible stroke requiring workup.  Workup: Labs: POC glucose, CBC, BMP, LFTs, Troponin, PT/INR, PTT, Type and Screen Other Diagnostics: ECG, CXR, non-contrast head CT followed by CTA brain and neck (to r/o large vessel occlusion amenable to thrombectomy) Interventions: Patient improving on NIH scale and therefore not a candidate for tpa.  Consult: Neurology. Discussed with Dr. Pearlean Brownie regarding  patients neurological symptoms and last well-known time and eligibility for TPA criteria. Disposition: Admission to medicine.   FINAL CLINICAL IMPRESSION(S) / ED DIAGNOSES   Final diagnoses:  Slurred speech  Acute right-sided weakness  Facial droop   Rx / DC Orders   ED Discharge Orders     None      Note:  This document was prepared using Dragon voice recognition software and may include unintentional dictation errors.   Merwyn Katos, MD 10/22/22 [phone]

## 2022-10-23 ENCOUNTER — Observation Stay (HOSPITAL_BASED_OUTPATIENT_CLINIC_OR_DEPARTMENT_OTHER)
Admit: 2022-10-23 | Discharge: 2022-10-23 | Disposition: A | Discharge status: Home / Self Care | Payer: 59 | Attending: Internal Medicine | Admitting: Internal Medicine

## 2022-10-23 ENCOUNTER — Encounter: Payer: Self-pay | Admitting: Internal Medicine

## 2022-10-23 DIAGNOSIS — R4781 Slurred speech: Secondary | ICD-10-CM | POA: Diagnosis not present

## 2022-10-23 DIAGNOSIS — G4733 Obstructive sleep apnea (adult) (pediatric): Secondary | ICD-10-CM

## 2022-10-23 DIAGNOSIS — G40909 Epilepsy, unspecified, not intractable, without status epilepticus: Secondary | ICD-10-CM | POA: Diagnosis not present

## 2022-10-23 DIAGNOSIS — I639 Cerebral infarction, unspecified: Secondary | ICD-10-CM | POA: Diagnosis not present

## 2022-10-23 DIAGNOSIS — G459 Transient cerebral ischemic attack, unspecified: Secondary | ICD-10-CM | POA: Diagnosis not present

## 2022-10-23 DIAGNOSIS — N1831 Chronic kidney disease, stage 3a: Secondary | ICD-10-CM

## 2022-10-23 DIAGNOSIS — I10 Essential (primary) hypertension: Secondary | ICD-10-CM

## 2022-10-23 DIAGNOSIS — F32A Depression, unspecified: Secondary | ICD-10-CM

## 2022-10-23 LAB — ECHOCARDIOGRAM COMPLETE
AR max vel: 2.19 cm2
AV Area VTI: 2.43 cm2
AV Area mean vel: 2.16 cm2
AV Mean grad: 3 mmHg
AV Peak grad: 4.9 mmHg
Ao pk vel: 1.11 m/s
Area-P 1/2: 1.73 cm2
Height: 62 in
MV M vel: 5.29 m/s
MV Peak grad: 111.9 mmHg
MV VTI: 1.12 cm2
Radius: 0.9 cm
S' Lateral: 3.8 cm
Weight: 4067.05 oz

## 2022-10-23 LAB — LIPID PANEL
Cholesterol: 146 mg/dL (ref 0–200)
HDL: 38 mg/dL — ABNORMAL LOW (ref >40)
LDL Cholesterol: 96 mg/dL (ref 0–99)
Total CHOL/HDL Ratio: 3.8 RATIO
Triglycerides: 62 mg/dL (ref <150)
VLDL: 12 mg/dL (ref 0–40)

## 2022-10-23 LAB — GLUCOSE, CAPILLARY: Glucose-Capillary: 69 mg/dL — ABNORMAL LOW (ref 70–99)

## 2022-10-23 MED ORDER — DIVALPROEX SODIUM 250 MG PO DR TAB
250.0000 mg | DELAYED_RELEASE_TABLET | Freq: Every day | ORAL | Status: DC
  Administered 2022-10-23: 250 mg via ORAL
  Filled 2022-10-23: qty 1

## 2022-10-23 MED ORDER — PNEUMOCOCCAL 20-VAL CONJ VACC 0.5 ML IM SUSY
0.5000 mL | PREFILLED_SYRINGE | INTRAMUSCULAR | Status: DC
  Filled 2022-10-23: qty 0.5

## 2022-10-23 MED ORDER — LAMOTRIGINE 100 MG PO TABS
100.0000 mg | ORAL_TABLET | Freq: Every day | ORAL | Status: DC
  Administered 2022-10-23: 100 mg via ORAL
  Filled 2022-10-23: qty 1

## 2022-10-23 NOTE — Progress Notes (Signed)
Physical Therapy Treatment Patient Details Name: Dawn Stein MRN: [id number] DOB: [birth year] Today's Date: 10/23/2022   History of Present Illness Dawn Stein is a 52 y.o. female with medical history significant of multiple prior CVA, seizure disorder, hypertension and depression came to ED with complaint of sudden onset bilateral lower extremity weakness and persistent dizziness and also having difficulty speaking.     She was last seen normal at 1:40 PM when her son left for work. She called him at 2:50 PM saying she was not feeling well and to call the ambulance.     Patient has an history of multiple strokes in the past.  Only residual deficit is loss of peripheral vision in both eyes.  He had multiple strokes in the past involving bilateral MCA territory and left PCA infarct.     Patient denies any recent illness, headache, fever or chills.  No shortness of breath or chest pain.  No urinary symptoms.  No recent change in appetite or bowel habits.    PT Comments    Pt is a pleasant 52 year old female who was admitted for TIA.  Pt demonstrates all bed mobility/transfers/ambulation at baseline level. No sensation deficits or balance concerns. Pt is A&O and is requesting quick discharge so she can smoke a cigarette. Pt does not require any further PT needs at this time. Pt will be dc in house and does not require follow up. RN aware. Will dc current orders.   Recommendations for follow up therapy are one component of a multi-disciplinary discharge planning process, led by the attending physician.  Recommendations may be updated based on patient status, additional functional criteria and insurance authorization.  Follow Up Recommendations       Assistance Recommended at Discharge None  Patient can return home with the following     Equipment Recommendations  None recommended by PT    Recommendations for Other Services       Precautions / Restrictions  Precautions Precautions: None Restrictions Weight Bearing Restrictions: No     Mobility  Bed Mobility               General bed mobility comments: NT, received in recliner    Transfers Overall transfer level: Independent Equipment used: None               General transfer comment: safe technique with upright posture. Able to don shoes prior to mobility    Ambulation/Gait Ambulation/Gait assistance: Independent Gait Distance (Feet): 200 Feet Assistive device: None Gait Pattern/deviations: WFL(Within Functional Limits)       General Gait Details: able to carry conversation with mobility without LOB. Symmetrical stepping   Stairs             Wheelchair Mobility    Modified Rankin (Stroke Patients Only)       Balance Overall balance assessment: Independent                                          Cognition Arousal/Alertness: Awake/alert Behavior During Therapy: WFL for tasks assessed/performed Overall Cognitive Status: Within Functional Limits for tasks assessed                                          Exercises  General Comments        Pertinent Vitals/Pain Pain Assessment Pain Assessment: No/denies pain    Home Living Family/patient expects to be discharged to:: Private residence Living Arrangements: Children Available Help at Discharge: Family;Available PRN/intermittently Type of Home: Apartment Home Access: Level entry       Home Layout: One level Home Equipment: Cane - single point Additional Comments: Pt doesn't work, lives with son who is available PRN    Prior Function            PT Goals (current goals can now be found in the care plan section) Acute Rehab PT Goals Patient Stated Goal: to go home PT Goal Formulation: All assessment and education complete, DC therapy Time For Goal Achievement: 10/23/22 Potential to Achieve Goals: Good    Frequency           PT  Plan      Co-evaluation              AM-PAC PT "6 Clicks" Mobility   Outcome Measure  Help needed turning from your back to your side while in a flat bed without using bedrails?: None Help needed moving from lying on your back to sitting on the side of a flat bed without using bedrails?: None Help needed moving to and from a bed to a chair (including a wheelchair)?: None Help needed standing up from a chair using your arms (e.g., wheelchair or bedside chair)?: None Help needed to walk in hospital room?: None Help needed climbing 3-5 steps with a railing? : None 6 Click Score: 24    End of Session   Activity Tolerance: Patient tolerated treatment well Patient left: in chair Nurse Communication: Mobility status PT Visit Diagnosis: Muscle weakness (generalized) (M62.81)     Time: 1610-9604 PT Time Calculation (min) (ACUTE ONLY): 12 min  Charges:                        Elizabeth Palau, PT, DPT, GCS [phone]    Keyana Guevara 10/23/2022, 11:03 AM

## 2022-10-23 NOTE — Progress Notes (Signed)
*  PRELIMINARY RESULTS* Echocardiogram 2D Echocardiogram has been performed.  Cristela Blue 10/23/2022, 7:50 AM

## 2022-10-23 NOTE — Progress Notes (Signed)
OT Cancellation Note  Patient Details Name: Dawn Stein MRN: [id number] DOB: Jan 12, 1971   Cancelled Treatment:    Reason Eval/Treat Not Completed: OT screened, no needs identified, will sign off. OT orders received, chart reviewed. Pt reports all symptoms have resolved. Appears to be at baseline. Pt agrees. Denies any ADL/IADL concerns. No skilled OT needs indicated at this time. Please re-consult if there are any acute changes.   Gerrie Nordmann 10/23/2022, 10:39 AM

## 2022-10-23 NOTE — Plan of Care (Signed)
  Problem: Education: Goal: Knowledge of disease or condition will improve Outcome: Progressing Goal: Knowledge of secondary prevention will improve (MUST DOCUMENT ALL) Outcome: Progressing Goal: Knowledge of patient specific risk factors will improve [name] N/A or DELETE if not current risk factor) Outcome: Progressing   Problem: Ischemic Stroke/TIA Tissue Perfusion: Goal: Complications of ischemic stroke/TIA will be minimized Outcome: Progressing   Problem: Coping: Goal: Will verbalize positive feelings about self Outcome: Progressing   Problem: Health Behavior/Discharge Planning: Goal: Ability to manage health-related needs will improve Outcome: Progressing   Problem: Self-Care: Goal: Ability to participate in self-care as condition permits will improve Outcome: Progressing Goal: Verbalization of feelings and concerns over difficulty with self-care will improve Outcome: Progressing Goal: Ability to communicate needs accurately will improve Outcome: Progressing   Problem: Nutrition: Goal: Risk of aspiration will decrease Outcome: Progressing   Problem: Education: Goal: Knowledge of General Education information will improve Description: Including pain rating scale, medication(s)/side effects and non-pharmacologic comfort measures Outcome: Progressing   Problem: Health Behavior/Discharge Planning: Goal: Ability to manage health-related needs will improve Outcome: Progressing   Problem: Clinical Measurements: Goal: Ability to maintain clinical measurements within normal limits will improve Outcome: Progressing Goal: Will remain free from infection Outcome: Progressing   Problem: Activity: Goal: Risk for activity intolerance will decrease Outcome: Progressing   Problem: Nutrition: Goal: Adequate nutrition will be maintained Outcome: Progressing   Problem: Coping: Goal: Level of anxiety will decrease Outcome: Progressing   Problem: Elimination: Goal: Will not  experience complications related to bowel motility Outcome: Progressing   Problem: Pain Managment: Goal: General experience of comfort will improve Outcome: Progressing   Problem: Safety: Goal: Ability to remain free from injury will improve Outcome: Progressing   Problem: Skin Integrity: Goal: Risk for impaired skin integrity will decrease Outcome: Progressing

## 2022-10-23 NOTE — Discharge Summary (Signed)
Physician Discharge Summary   Patient: Dawn Stein MRN: [id number] DOB: Jun 09, 1971  Admit date:     10/22/2022  Discharge date: 10/23/22  Discharge Physician: Arnetha Courser   PCP: Barbette Reichmann, MD   Recommendations at discharge:  Follow-up with primary care provider Follow-up with outpatient neurology  Discharge Diagnoses: Principal Problem:   TIA (transient ischemic attack) Active Problems:   History of Recurrent cerebrovascular accidents (CVAs) (HCC)   Seizure disorder   Depression   CKD (chronic kidney disease), stage IIIa   Benign essential hypertension   Obesity, Class III, BMI 40-49.9 (morbid obesity)   HLD (hyperlipidemia)   OSA (obstructive sleep apnea)   Hospital Course:  Dawn Stein is a 52 y.o. female with medical history significant of multiple prior CVA, seizure disorder, hypertension and depression came to ED with complaint of sudden onset bilateral lower extremity weakness and persistent dizziness and also having difficulty speaking.   She was last seen normal at 1:40 PM when her son left for work. She called him at 2:50 PM saying she was not feeling well and to call the ambulance.   Her symptoms improve when seen in ED.  ED course.  Vital stable.  CT head and CTA was negative for any acute abnormalities.  Labs mostly at baseline. EKG.  Personally reviewed with sinus rhythm and no other abnormality.   Teleneurology was consulted and they were advising admission for stroke workup.  No tPA needed.  4/23: Hemodynamically stable.  Lipid panel mostly normal with LDL of 96 which is improved from prior of 137, goal is less than 70.  MRI brain with no acute intracranial process. No specific recommendations from PT and OT.  Echocardiogram with normal EF, grade 2 diastolic dysfunction and LVH.  Patient is at her baseline and no new complaints or deficits. She is being discharged on her home medications.  Patient was already on aspirin, Plavix  and statin which she will continue.  Patient need to have a close follow-up with her providers for further recommendations.  Assessment and Plan: * TIA (transient ischemic attack) Patient presented with bilateral lower extremity weakness and dizziness which has been resolved at the time of interview. CT head and CTA was negative for any acute changes. -Admitted under observation and medical telemetry. -Complete stroke workup which will include MRI, echocardiogram, A1c, lipid panel, PT and OT evaluation -Continue home aspirin, Plavix and statin  History of Recurrent cerebrovascular accidents (CVAs) (HCC) Patient has an history of multiple strokes in the past, last 1 was few years ago.  No residual deficit. -Continue home aspirin, Plavix and statin  Seizure disorder Patient has an history of seizure for many years, last seizure was many year ago.  No acute concern -Continue home Depakote and Lamictal after the med rec  Depression Patient is on Effexor at home-continue after the med rec  CKD (chronic kidney disease), stage IIIa Renal function seems to be at baseline. -Monitor renal function -Avoid nephrotoxins  Benign essential hypertension Currently normotensive. -Continue home amlodipine medication once med rec is completed  Obesity, Class III, BMI 40-49.9 (morbid obesity) Estimated body mass index is 46.53 kg/m as calculated from the following:   Height as of this encounter: 5\' 2"  (1.575 m).   Weight as of this encounter: 115.4 kg.   -This will complicate overall prognosis  HLD (hyperlipidemia) -Continue home statin -Check lipid profile tomorrow  OSA (obstructive sleep apnea) -CPAP at night   Consultants: Teleneurology Procedures performed: None Disposition: Home Diet recommendation:  Discharge Diet Orders (From admission, onward)     Start     Ordered   10/23/22 0000  Diet - low sodium heart healthy        10/23/22 1214           Cardiac diet DISCHARGE  MEDICATION: Allergies as of 10/23/2022       Reactions   Tetracyclines & Related Anaphylaxis   Facial swelling        Medication List     TAKE these medications    albuterol 108 (90 Base) MCG/ACT inhaler Commonly known as: VENTOLIN HFA Inhale 2 puffs into the lungs every 6 (six) hours as needed for wheezing or shortness of breath.   amLODipine 5 MG tablet Commonly known as: NORVASC Take 5 mg by mouth daily.   aspirin EC 81 MG tablet Take 81 mg by mouth daily.   atorvastatin 10 MG tablet Commonly known as: LIPITOR Take 10 mg by mouth daily.   clopidogrel 75 MG tablet Commonly known as: PLAVIX Take 75 mg by mouth daily.   divalproex 250 MG DR tablet Commonly known as: DEPAKOTE Take 250 mg by mouth daily.   lamoTRIgine 100 MG tablet Commonly known as: LAMICTAL Take 100 mg by mouth daily.   lamoTRIgine 200 MG tablet Commonly known as: LAMICTAL Take 200 mg by mouth every evening.   venlafaxine XR 75 MG 24 hr capsule Commonly known as: EFFEXOR-XR Take 75 mg by mouth daily.        Follow-up Information     Barbette Reichmann, MD. Schedule an appointment as soon as possible for a visit in 1 week(s).   Specialty: Internal Medicine Contact information: [address] [place] [address] [phone]                Discharge Exam: Ceasar Mons Weights   10/22/22 1616 10/22/22 1702 10/22/22 2044  Weight: 115.4 kg 115.4 kg 115.3 kg   General.  Morbidly obese lady, in no acute distress. Pulmonary.  Lungs clear bilaterally, normal respiratory effort. CV.  Regular rate and rhythm, no JVD, rub or murmur. Abdomen.  Soft, nontender, nondistended, BS positive. CNS.  Alert and oriented .  No focal neurologic deficit. Extremities.  No edema, no cyanosis, pulses intact and symmetrical. Psychiatry.  Judgment and insight appears normal.   Condition at discharge: stable  The results of significant diagnostics from this hospitalization  (including imaging, microbiology, ancillary and laboratory) are listed below for reference.   Imaging Studies: ECHOCARDIOGRAM COMPLETE  Result Date: 10/23/2022    ECHOCARDIOGRAM REPORT   Patient Name:   Dawn Stein Date of Exam: 10/23/2022 Medical Rec #:  [id number]                Height:       62.0 in Accession #:    [id number]               Weight:       254.2 lb Date of Birth:  02/09/1971                 BSA:          2.117 m Patient Age:    52 years                 BP:           106/52 mmHg Patient Gender: F  HR:           58 bpm. Exam Location:  [facility] Procedure: 2D Echo, Cardiac Doppler and Color Doppler Indications:     Stroke I63.9  History:         Patient has prior history of Echocardiogram examinations, most                  recent 10/26/2016. Stroke. Seizures.  Sonographer:     Cristela Blue Referring Phys:  [id number] [facility] Tylynn Braniff Diagnosing Phys: Yvonne Kendall MD IMPRESSIONS  1. Left ventricular ejection fraction, by estimation, is 60 to 65%. The left ventricle has normal function. The left ventricle has no regional wall motion abnormalities. The left ventricular internal cavity size was moderately dilated. There is mild left ventricular hypertrophy. Left ventricular diastolic parameters are consistent with Grade II diastolic dysfunction (pseudonormalization). Elevated left atrial pressure.  2. Right ventricular systolic function is normal. The right ventricular size is normal. There is normal pulmonary artery systolic pressure.  3. Left atrial size was mildly dilated.  4. The mitral valve is degenerative. Moderate to severe mitral valve regurgitation. No evidence of mitral stenosis.  5. The aortic valve has an indeterminant number of cusps. Aortic valve regurgitation is not visualized. No aortic stenosis is present.  6. The inferior vena cava is normal in size with greater than 50% respiratory variability, suggesting right atrial pressure of 3 mmHg. FINDINGS  Left  Ventricle: Left ventricular ejection fraction, by estimation, is 60 to 65%. The left ventricle has normal function. The left ventricle has no regional wall motion abnormalities. The left ventricular internal cavity size was moderately dilated. There is mild left ventricular hypertrophy. Left ventricular diastolic parameters are consistent with Grade II diastolic dysfunction (pseudonormalization). Elevated left atrial pressure. Right Ventricle: The right ventricular size is normal. No increase in right ventricular wall thickness. Right ventricular systolic function is normal. There is normal pulmonary artery systolic pressure. The tricuspid regurgitant velocity is 2.07 m/s, and  with an assumed right atrial pressure of 3 mmHg, the estimated right ventricular systolic pressure is 20.1 mmHg. Left Atrium: Left atrial size was mildly dilated. Right Atrium: Right atrial size was normal in size. Pericardium: Trivial pericardial effusion is present. The pericardial effusion is posterior to the left ventricle. Mitral Valve: The mitral valve is degenerative in appearance. There is moderate thickening of the posterior mitral valve leaflet(s). There is moderate calcification of the mitral valve leaflet(s). Moderate to severe mitral valve regurgitation. No evidence of mitral valve stenosis. MV peak gradient, 15.5 mmHg. The mean mitral valve gradient is 4.0 mmHg. Tricuspid Valve: The tricuspid valve is normal in structure. Tricuspid valve regurgitation is trivial. Aortic Valve: The aortic valve has an indeterminant number of cusps. Aortic valve regurgitation is not visualized. No aortic stenosis is present. Aortic valve mean gradient measures 3.0 mmHg. Aortic valve peak gradient measures 4.9 mmHg. Aortic valve area, by VTI measures 2.43 cm. Pulmonic Valve: The pulmonic valve was normal in structure. Pulmonic valve regurgitation is not visualized. No evidence of pulmonic stenosis. Aorta: The aortic root is normal in size and  structure. Pulmonary Artery: The pulmonary artery is of normal size. Venous: The inferior vena cava is normal in size with greater than 50% respiratory variability, suggesting right atrial pressure of 3 mmHg. IAS/Shunts: The interatrial septum was not well visualized.  LEFT VENTRICLE PLAX 2D LVIDd:         5.80 cm   Diastology LVIDs:         3.80 cm  LV e' medial:    9.03 cm/s LV PW:         1.10 cm   LV E/e' medial:  16.3 LV IVS:        1.10 cm   LV e' lateral:   6.74 cm/s LVOT diam:     2.10 cm   LV E/e' lateral: 21.8 LV SV:         57 LV SV Index:   27 LVOT Area:     3.46 cm  RIGHT VENTRICLE RV Basal diam:  3.20 cm RV Mid diam:    2.30 cm LEFT ATRIUM             Index        RIGHT ATRIUM          Index LA diam:        4.30 cm 2.03 cm/m   RA Area:     9.53 cm LA Vol (A2C):   82.2 ml 38.83 ml/m  RA Volume:   17.60 ml 8.31 ml/m LA Vol (A4C):   52.9 ml 24.99 ml/m LA Biplane Vol: 69.0 ml 32.60 ml/m  AORTIC VALVE AV Area (Vmax):    2.19 cm AV Area (Vmean):   2.16 cm AV Area (VTI):     2.43 cm AV Vmax:           111.00 cm/s AV Vmean:          79.550 cm/s AV VTI:            0.235 m AV Peak Grad:      4.9 mmHg AV Mean Grad:      3.0 mmHg LVOT Vmax:         70.30 cm/s LVOT Vmean:        49.700 cm/s LVOT VTI:          0.165 m LVOT/AV VTI ratio: 0.70  AORTA Ao Root diam: 3.03 cm MITRAL VALVE                  TRICUSPID VALVE MV Area (PHT): 1.73 cm       TR Peak grad:   17.1 mmHg MV Area VTI:   1.12 cm       TR Vmax:        207.00 cm/s MV Peak grad:  15.5 mmHg MV Mean grad:  4.0 mmHg       SHUNTS MV Vmax:       1.97 m/s       Systemic VTI:  0.16 m MV Vmean:      89.5 cm/s      Systemic Diam: 2.10 cm MV Decel Time: 438 msec MR Peak grad:    111.9 mmHg MR Mean grad:    74.0 mmHg MR Vmax:         529.00 cm/s MR Vmean:        412.0 cm/s MR PISA:         5.09 cm MR PISA Eff ROA: 37 mm MR PISA Radius:  0.90 cm MV E velocity: 147.00 cm/s MV A velocity: 99.60 cm/s MV E/A ratio:  1.48 Cristal Deer End MD Electronically  signed by Yvonne Kendall MD Signature Date/Time: 10/23/2022/11:54:45 AM    Final    MR BRAIN WO CONTRAST  Result Date: 10/22/2022 CLINICAL DATA:  Sudden onset bilateral lower extremity weakness, persistent dizziness, history of prior infarcts EXAM: MRI HEAD WITHOUT CONTRAST TECHNIQUE: Multiplanar, multiecho pulse sequences of the brain and surrounding structures were obtained without intravenous contrast. COMPARISON:  02/05/2021 MRI head FINDINGS: Brain: No restricted diffusion to suggest acute or subacute infarct. No acute hemorrhage, mass, mass effect, or midline shift. No hydrocephalus or extra-axial collection. Empty sella. Normal craniocervical junction. Redemonstrated remote infarcts in the bilateral frontal and occipital lobes. Advanced cerebral atrophy for age. Remote bilateral cerebellar infarcts. Vascular: Normal arterial flow voids. Skull and upper cervical spine: Normal marrow signal. Sinuses/Orbits: Clear paranasal sinuses. No acute finding in the orbits. Other: The mastoid air cells are well aerated. IMPRESSION: No acute intracranial process. No evidence of acute or subacute infarct. Electronically Signed   By: Wiliam Ke M.D.   On: 10/22/2022 18:55   CT ANGIO HEAD NECK W WO CM  Result Date: 10/22/2022 CLINICAL DATA:  Stroke code, speech changes EXAM: CT ANGIOGRAPHY HEAD AND NECK WITH AND WITHOUT CONTRAST TECHNIQUE: Multidetector CT imaging of the head and neck was performed using the standard protocol during bolus administration of intravenous contrast. Multiplanar CT image reconstructions and MIPs were obtained to evaluate the vascular anatomy. Carotid stenosis measurements (when applicable) are obtained utilizing NASCET criteria, using the distal internal carotid diameter as the denominator. RADIATION DOSE REDUCTION: This exam was performed according to the departmental dose-optimization program which includes automated exposure control, adjustment of the mA and/or kV according to patient  size and/or use of iterative reconstruction technique. CONTRAST:  OMNIPAQUE IOHEXOL 350 MG/ML SOLN COMPARISON:  No prior CTA available, correlation is made with MRA head 10/26/2016; correlation is also made with CT head 10/22/2022 FINDINGS: CT HEAD FINDINGS For noncontrast findings, please see same day CT head. CTA NECK FINDINGS Aortic arch: Standard branching. Imaged portion shows no evidence of aneurysm or dissection. No significant stenosis of the major arch vessel origins. Right carotid system: No evidence of stenosis, dissection, or occlusion. Left carotid system: No evidence of stenosis, dissection, or occlusion. Vertebral arteries: No evidence of stenosis, dissection, or occlusion. Skeleton: No acute osseous abnormality. Degenerative changes in the cervical spine. Other neck: Negative. Upper chest: No focal pulmonary opacity or pleural effusion. Review of the MIP images confirms the above findings CTA HEAD FINDINGS Evaluation is somewhat limited by bolus timing. Anterior circulation: Both internal carotid arteries are patent to the termini, without significant stenosis. A1 segments patent. Normal anterior communicating artery. Anterior cerebral arteries are patent through the A2 and A3 segments, with evaluation of the more distal ACAs somewhat limited by contrast timing. No M1 stenosis or occlusion. MCA branches perfused through the M2 and M3 segments, with evaluation of the more distal MCAs somewhat limited by contrast timing. Posterior circulation: Vertebral arteries patent to the vertebrobasilar junction with mild stenosis in the mid left V4 (series 6, image 138). Posterior inferior cerebellar arteries patent proximally. Basilar patent to its distal aspect without significant stenosis. Superior cerebellar arteries patent proximally. Patent P1 segments. The PCA s are perfused through the P2 segments, with evaluation of the more distal PCA segments limited by bolus timing. The bilateral posterior  communicating arteries are not visualized. Venous sinuses: Not well opacified due to phase of contrast. Anatomic variants: None significant. Review of the MIP images confirms the above findings IMPRESSION: 1. Evaluation of the intracranial vasculature is somewhat limited by bolus timing. Within this limitation, no intracranial large vessel occlusion. Mild stenosis in the mid left V4. 2. No hemodynamically significant stenosis in the neck. Electronically Signed   By: Wiliam Ke M.D.   On: 10/22/2022 16:56   CT HEAD CODE STROKE WO CONTRAST  Result Date: 10/22/2022 CLINICAL DATA:  Code stroke. EXAM:  CT HEAD WITHOUT CONTRAST TECHNIQUE: Contiguous axial images were obtained from the base of the skull through the vertex without intravenous contrast. RADIATION DOSE REDUCTION: This exam was performed according to the departmental dose-optimization program which includes automated exposure control, adjustment of the mA and/or kV according to patient size and/or use of iterative reconstruction technique. COMPARISON:  None Available. FINDINGS: Brain: No evidence of acute large vascular territory infarct, acute hemorrhage, mass lesion, midline shift or hydrocephalus. Similar appearance of multiple remote bilateral frontal lobe, right parieto-occipital and left occipital lobe infarcts. Similar appearance of remote bilateral cerebellar infarcts. Partially empty sella. Vascular: No hyperdense vessel identified. Calcific atherosclerosis. Skull: No acute fracture. Sinuses/Orbits: Clear sinuses. ASPECTS [facility] Stroke Program Early CT Score) total score (0-10 with 10 being normal): 10. IMPRESSION: 1. No evidence of acute large vascular territory infarct or acute hemorrhage. ASPECTS is 10. 2. Multiple old infarcts appear similar to the prior. An MRI could provide more sensitive evaluation for acute infarct if clinically warranted. 3. Partially empty sella, which is often a normal anatomic variant but can be associated with  idiopathic intracranial hypertension. Code stroke imaging results were communicated on 10/22/2022 at 4:06 pm to provider Dr. Pearlean Brownie via secure text paging. Electronically Signed   By: Feliberto Harts M.D.   On: 10/22/2022 16:07    Microbiology: Results for orders placed or performed during the hospital encounter of 05/13/22  Resp Panel by RT-PCR (Flu A&B, Covid) Anterior Nasal Swab     Status: None   Collection Time: 05/13/22  1:31 PM   Specimen: Anterior Nasal Swab  Result Value Ref Range Status   SARS Coronavirus 2 by RT PCR NEGATIVE NEGATIVE Final    Comment: (NOTE) SARS-CoV-2 target nucleic acids are NOT DETECTED.  The SARS-CoV-2 RNA is generally detectable in upper respiratory specimens during the acute phase of infection. The lowest concentration of SARS-CoV-2 viral copies this assay can detect is 138 copies/mL. A negative result does not preclude SARS-Cov-2 infection and should not be used as the sole basis for treatment or other patient management decisions. A negative result may occur with  improper specimen collection/handling, submission of specimen other than nasopharyngeal swab, presence of viral mutation(s) within the areas targeted by this assay, and inadequate number of viral copies(<138 copies/mL). A negative result must be combined with clinical observations, patient history, and epidemiological information. The expected result is Negative.  Fact Sheet for Patients:  BloggerCourse.com  Fact Sheet for Healthcare Providers:  SeriousBroker.it  This test is no t yet approved or cleared by the [place] FDA and  has been authorized for detection and/or diagnosis of SARS-CoV-2 by FDA under an Emergency Use Authorization (EUA). This EUA will remain  in effect (meaning this test can be used) for the duration of the COVID-19 declaration under Section 564(b)(1) of the Act, 21 U.S.C.section 360bbb-3(b)(1), unless the  authorization is terminated  or revoked sooner.       Influenza A by PCR NEGATIVE NEGATIVE Final   Influenza B by PCR NEGATIVE NEGATIVE Final    Comment: (NOTE) The Xpert Xpress SARS-CoV-2/FLU/RSV plus assay is intended as an aid in the diagnosis of influenza from Nasopharyngeal swab specimens and should not be used as a sole basis for treatment. Nasal washings and aspirates are unacceptable for Xpert Xpress SARS-CoV-2/FLU/RSV testing.  Fact Sheet for Patients: BloggerCourse.com  Fact Sheet for Healthcare Providers: SeriousBroker.it  This test is not yet approved or cleared by the [place] FDA and has been authorized for detection and/or diagnosis  of SARS-CoV-2 by FDA under an Emergency Use Authorization (EUA). This EUA will remain in effect (meaning this test can be used) for the duration of the COVID-19 declaration under Section 564(b)(1) of the Act, 21 U.S.C. section 360bbb-3(b)(1), unless the authorization is terminated or revoked.  Performed at [facility], [address] [place], [place] [address]   Respiratory (~20 pathogens) panel by PCR     Status: Abnormal   Collection Time: 05/13/22  9:17 PM   Specimen: Nasopharyngeal Swab; Respiratory  Result Value Ref Range Status   Adenovirus NOT DETECTED NOT DETECTED Final   Coronavirus 229E NOT DETECTED NOT DETECTED Final    Comment: (NOTE) The Coronavirus on the Respiratory Panel, DOES NOT test for the novel  Coronavirus (2019 nCoV)    Coronavirus HKU1 NOT DETECTED NOT DETECTED Final   Coronavirus NL63 NOT DETECTED NOT DETECTED Final   Coronavirus OC43 NOT DETECTED NOT DETECTED Final   Metapneumovirus DETECTED (A) NOT DETECTED Final   Rhinovirus / Enterovirus NOT DETECTED NOT DETECTED Final   Influenza A NOT DETECTED NOT DETECTED Final   Influenza B NOT DETECTED NOT DETECTED Final   Parainfluenza Virus 1 NOT DETECTED NOT DETECTED Final   Parainfluenza  Virus 2 NOT DETECTED NOT DETECTED Final   Parainfluenza Virus 3 NOT DETECTED NOT DETECTED Final   Parainfluenza Virus 4 NOT DETECTED NOT DETECTED Final   Respiratory Syncytial Virus NOT DETECTED NOT DETECTED Final   Bordetella pertussis NOT DETECTED NOT DETECTED Final   Bordetella Parapertussis NOT DETECTED NOT DETECTED Final   Chlamydophila pneumoniae NOT DETECTED NOT DETECTED Final   Mycoplasma pneumoniae NOT DETECTED NOT DETECTED Final    Comment: Performed at [facility], [address], [place], [place] [address]    Labs: CBC: Recent Labs  Lab 10/22/22 1553  WBC 5.3  NEUTROABS 3.7  HGB 12.1  HCT 38.6  MCV 79.8*  PLT 188   Basic Metabolic Panel: Recent Labs  Lab 10/22/22 1553  NA 141  K 3.9  CL 109  CO2 23  GLUCOSE 77  BUN 24*  CREATININE 1.38*  CALCIUM 8.8*   Liver Function Tests: Recent Labs  Lab 10/22/22 1553  AST 16  ALT 11  ALKPHOS 73  BILITOT 0.3  PROT 6.4*  ALBUMIN 3.6   CBG: Recent Labs  Lab 10/22/22 1546 10/22/22 1615  GLUCAP 69* 88    Discharge time spent: greater than 30 minutes.  This record has been created using Conservation officer, historic buildings. Errors have been sought and corrected,but may not always be located. Such creation errors do not reflect on the standard of care.   Signed: Arnetha Courser, MD [facility] 10/23/2022

## 2022-10-23 NOTE — Progress Notes (Signed)
SLP Cancellation Note  Patient Details Name: Dawn Stein MRN: [id number] DOB: 1971-03-15   Cancelled treatment:       Reason Eval/Treat Not Completed: SLP screened, no needs identified, will sign off (no immediate/acute needs identified -- pt declined any new issues.)  Pt denied any difficulty swallowing and is currently on a regular diet; tolerates swallowing pills w/ water per NSG. Pt requested some juice.  Pt conversed in general conversation w/out gross, overt expressive/receptive deficits noted; pt denied any new speech-language deficits. Speech intelligible, clear. Pt answered general orientation questions including the correct month and year(incorrect year earier this morning w/ NSG at shift change). Pt also gave her street address and phone # -- confirmed street address w/ Son when she called him on the phone. Pt stated she has had "many small strokes in recent years". NSG reported pt has been verbally engaging w/ her this morning appropriately and is ready to "go home". Noted MRI results: "Redemonstrated remote infarcts in the bilateral frontal and occipital lobes. Advanced cerebral atrophy for age. Remote bilateral cerebellar infarcts.". No acute intracranial process. No evidence of acute or subacute infarct this admit.   No further Acute skilled ST services indicated as pt appears at her baseline. Pt agreed. Recommend f/u w/ PCP for referral to outpatient ST services if any needs upon return home to ADLs. Pt agreed. NSG to reconsult if any change in status while admitted.      Jerilynn Som, MS, CCC-SLP Speech Language Pathologist Rehab Services; [facility] [phone] (ascom) Jakori Burkett 10/23/2022, 10:54 AM

## 2022-11-29 ENCOUNTER — Encounter
Admission: RE | Disposition: A | Discharge status: Home / Self Care | Payer: Self-pay | Source: Home / Self Care | Attending: Gastroenterology

## 2022-11-29 ENCOUNTER — Ambulatory Visit
Admission: RE | Admit: 2022-11-29 | Discharge: 2022-11-29 | Disposition: A | Discharge status: Home / Self Care | Payer: 59 | Attending: Gastroenterology | Admitting: Gastroenterology

## 2022-11-29 ENCOUNTER — Ambulatory Visit: Payer: 59 | Admitting: Anesthesiology

## 2022-11-29 ENCOUNTER — Encounter: Payer: Self-pay | Admitting: Gastroenterology

## 2022-11-29 DIAGNOSIS — Q438 Other specified congenital malformations of intestine: Secondary | ICD-10-CM | POA: Insufficient documentation

## 2022-11-29 DIAGNOSIS — Z1211 Encounter for screening for malignant neoplasm of colon: Secondary | ICD-10-CM | POA: Diagnosis present

## 2022-11-29 DIAGNOSIS — Z7902 Long term (current) use of antithrombotics/antiplatelets: Secondary | ICD-10-CM | POA: Diagnosis not present

## 2022-11-29 DIAGNOSIS — K6389 Other specified diseases of intestine: Secondary | ICD-10-CM | POA: Insufficient documentation

## 2022-11-29 HISTORY — PX: COLONOSCOPY WITH PROPOFOL: SHX5780

## 2022-11-29 SURGERY — COLONOSCOPY WITH PROPOFOL
Anesthesia: General

## 2022-11-29 MED ORDER — PROPOFOL 10 MG/ML IV BOLUS
INTRAVENOUS | Status: DC | PRN
  Administered 2022-11-29: 30 mg via INTRAVENOUS
  Administered 2022-11-29: 70 mg via INTRAVENOUS

## 2022-11-29 MED ORDER — SODIUM CHLORIDE 0.9 % IV SOLN: INTRAVENOUS | Status: DC

## 2022-11-29 MED ORDER — DEXMEDETOMIDINE HCL IN NACL 80 MCG/20ML IV SOLN
INTRAVENOUS | Status: DC | PRN
  Administered 2022-11-29: 12 ug via INTRAVENOUS

## 2022-11-29 MED ORDER — LIDOCAINE HCL (CARDIAC) PF 100 MG/5ML IV SOSY
PREFILLED_SYRINGE | INTRAVENOUS | Status: DC | PRN
  Administered 2022-11-29: 50 mg via INTRAVENOUS

## 2022-11-29 MED ORDER — PROPOFOL 500 MG/50ML IV EMUL
INTRAVENOUS | Status: DC | PRN
  Administered 2022-11-29: 150 ug/kg/min via INTRAVENOUS

## 2022-11-29 NOTE — Anesthesia Postprocedure Evaluation (Signed)
Anesthesia Post Note  Patient: Dawn Stein  Procedure(s) Performed: COLONOSCOPY WITH PROPOFOL  Patient location during evaluation: Endoscopy Anesthesia Type: General Level of consciousness: awake and alert Pain management: pain level controlled Vital Signs Assessment: post-procedure vital signs reviewed and stable Respiratory status: spontaneous breathing, nonlabored ventilation, respiratory function stable and patient connected to nasal cannula oxygen Cardiovascular status: blood pressure returned to baseline and stable Postop Assessment: no apparent nausea or vomiting Anesthetic complications: no   No notable events documented.   Last Vitals:  Vitals:   11/29/22 1041 11/29/22 1051  BP: 138/82 (!) 128/93  Pulse: 68 64  Resp: (!) 21 (!) 24  Temp:    SpO2: 100% 100%    Last Pain:  Vitals:   11/29/22 1051  TempSrc:   PainSc: 0-No pain                 Corinda Gubler

## 2022-11-29 NOTE — H&P (Signed)
Pre-Procedure H&P   Patient ID: Dawn Stein is a 52 y.o. female.  Gastroenterology Provider: Jaynie Collins, DO  Referring Provider: Jacob Moores, PA PCP: Barbette Reichmann, MD  Date: 11/29/2022  HPI Ms. Dawn Stein is a 52 y.o. female who presents today for Colonoscopy for CRC screening .  No previous colonoscopy. No fhx crc or polyps  Notes constipation with BM q2d. No melena/hematochezia.  Increased straining and feelings of incomplete evacuation.  Plavix has been held for procedure (last dose Saturday 11/24/22)  Hemoglobin 12.1 MCV 80 platelets 288,000   Past Medical History:  Diagnosis Date   Closed head injury    at age 89   Seizures (HCC)    Stroke [facility])     Past Surgical History:  Procedure Laterality Date   none      Family History Father- cirrhosis No h/o GI disease or malignancy  Review of Systems  Constitutional:  Negative for activity change, appetite change, chills, diaphoresis, fatigue, fever and unexpected weight change.  HENT:  Negative for trouble swallowing and voice change.   Respiratory:  Negative for shortness of breath and wheezing.   Cardiovascular:  Negative for chest pain, palpitations and leg swelling.  Gastrointestinal:  Positive for constipation. Negative for abdominal distention, abdominal pain, anal bleeding, blood in stool, diarrhea, nausea, rectal pain and vomiting.  Musculoskeletal:  Negative for arthralgias and myalgias.  Skin:  Negative for color change and pallor.  Neurological:  Negative for dizziness, syncope and weakness.  Psychiatric/Behavioral:  Negative for confusion.   All other systems reviewed and are negative.    Medications No current facility-administered medications on file prior to encounter.   Current Outpatient Medications on File Prior to Encounter  Medication Sig Dispense Refill   albuterol (VENTOLIN HFA) 108 (90 Base) MCG/ACT inhaler Inhale 2 puffs into the lungs every 6  (six) hours as needed for wheezing or shortness of breath. 8 g 1   amLODipine (NORVASC) 5 MG tablet Take 5 mg by mouth daily.     venlafaxine XR (EFFEXOR-XR) 75 MG 24 hr capsule Take 75 mg by mouth daily.     aspirin EC 81 MG tablet Take 81 mg by mouth daily.     atorvastatin (LIPITOR) 10 MG tablet Take 10 mg by mouth daily.     clopidogrel (PLAVIX) 75 MG tablet Take 75 mg by mouth daily.     divalproex (DEPAKOTE) 250 MG DR tablet Take 250 mg by mouth daily.      lamoTRIgine (LAMICTAL) 100 MG tablet Take 100 mg by mouth daily.     lamoTRIgine (LAMICTAL) 200 MG tablet Take 200 mg by mouth every evening.      Pertinent medications related to GI and procedure were reviewed by me with the patient prior to the procedure   Current Facility-Administered Medications:    0.9 %  sodium chloride infusion, , Intravenous, Continuous, Jaynie Collins, DO, Last Rate: 20 mL/hr at 11/29/22 0938, New Bag at 11/29/22 1610  sodium chloride 20 mL/hr at 11/29/22 9604       Allergies  Allergen Reactions   Tetracyclines & Related Anaphylaxis    Facial swelling   Allergies were reviewed by me prior to the procedure  Objective   Body mass index is 44.81 kg/m. Vitals:   11/29/22 0914 11/29/22 0935  BP:  120/70  Pulse:  69  Resp:  18  Temp:  (!) 97.1 F (36.2 C)  TempSrc:  Temporal  SpO2:  100%  Weight:  111.1 kg   Height: 5\' 2"  (1.575 m)      Physical Exam Vitals and nursing note reviewed.  Constitutional:      General: She is not in acute distress.    Appearance: Normal appearance. She is obese. She is not ill-appearing, toxic-appearing or diaphoretic.  HENT:     Head: Normocephalic and atraumatic.     Nose: Nose normal.     Mouth/Throat:     Mouth: Mucous membranes are moist.     Pharynx: Oropharynx is clear.  Eyes:     General: No scleral icterus.    Extraocular Movements: Extraocular movements intact.  Cardiovascular:     Rate and Rhythm: Normal rate and regular rhythm.      Heart sounds: Normal heart sounds. No murmur heard.    No friction rub. No gallop.  Pulmonary:     Effort: Pulmonary effort is normal. No respiratory distress.     Breath sounds: Normal breath sounds. No wheezing, rhonchi or rales.  Abdominal:     General: Bowel sounds are normal. There is no distension.     Palpations: Abdomen is soft.     Tenderness: There is no abdominal tenderness. There is no guarding or rebound.  Musculoskeletal:     Cervical back: Neck supple.     Right lower leg: No edema.     Left lower leg: No edema.  Skin:    General: Skin is warm and dry.     Coloration: Skin is not jaundiced or pale.  Neurological:     General: No focal deficit present.     Mental Status: She is alert and oriented to person, place, and time. Mental status is at baseline.  Psychiatric:        Mood and Affect: Mood normal.        Behavior: Behavior normal.        Thought Content: Thought content normal.        Judgment: Judgment normal.     Assessment:  Dawn Stein is a 52 y.o. female  who presents today for Colonoscopy for CRC screening .  Plan:  Colonoscopy with possible intervention today  Colonoscopy with possible biopsy, control of bleeding, polypectomy, and interventions as necessary has been discussed with the patient/patient representative. Informed consent was obtained from the patient/patient representative after explaining the indication, nature, and risks of the procedure including but not limited to death, bleeding, perforation, missed neoplasm/lesions, cardiorespiratory compromise, and reaction to medications. Opportunity for questions was given and appropriate answers were provided. Patient/patient representative has verbalized understanding is amenable to undergoing the procedure.   Jaynie Collins, DO  [facility] Gastroenterology  Portions of the record may have been created with voice recognition software. Occasional wrong-word or  'sound-a-like' substitutions may have occurred due to the inherent limitations of voice recognition software.  Read the chart carefully and recognize, using context, where substitutions may have occurred.

## 2022-11-29 NOTE — Anesthesia Procedure Notes (Signed)
Date/Time: 11/29/2022 9:48 AM  Performed by: Ginger Carne, CRNAPre-anesthesia Checklist: Patient identified, Emergency Drugs available, Suction available, Patient being monitored and Timeout performed Patient Re-evaluated:Patient Re-evaluated prior to induction Oxygen Delivery Method: Nasal cannula Preoxygenation: Pre-oxygenation with 100% oxygen Induction Type: IV induction

## 2022-11-29 NOTE — Transfer of Care (Signed)
Immediate Anesthesia Transfer of Care Note  Patient: Dawn Stein  Procedure(s) Performed: COLONOSCOPY WITH PROPOFOL  Patient Location: Endoscopy Unit  Anesthesia Type:General  Level of Consciousness: awake, alert , and oriented  Airway & Oxygen Therapy: Patient Spontanous Breathing  Post-op Assessment: Report given to RN and Post -op Vital signs reviewed and stable  Post vital signs: Reviewed and stable  Last Vitals:  Vitals Value Taken Time  BP 135/85 11/29/22 1031  Temp 35.9 C 11/29/22 1031  Pulse 71 11/29/22 1033  Resp 15 11/29/22 1033  SpO2 100 % 11/29/22 1033  Vitals shown include unvalidated device data.  Last Pain:  Vitals:   11/29/22 1031  TempSrc: Temporal  PainSc: 0-No pain         Complications: No notable events documented.

## 2022-11-29 NOTE — Interval H&P Note (Signed)
History and Physical Interval Note: Preprocedure H&P from 11/29/22  was reviewed and there was no interval change after seeing and examining the patient.  Written consent was obtained from the patient after discussion of risks, benefits, and alternatives. Patient has consented to proceed with Colonoscopy with possible intervention   11/29/2022 9:43 AM  [name]  has presented today for surgery, with the diagnosis of V76.51 (ICD-9-CM) - Z12.11 (ICD-10-CM) - Colon cancer screening.  The various methods of treatment have been discussed with the patient and family. After consideration of risks, benefits and other options for treatment, the patient has consented to  Procedure(s): COLONOSCOPY WITH PROPOFOL (N/A) as a surgical intervention.  The patient's history has been reviewed, patient examined, no change in status, stable for surgery.  I have reviewed the patient's chart and labs.  Questions were answered to the patient's satisfaction.     Dawn Stein

## 2022-11-29 NOTE — Op Note (Signed)
[facility] Gastroenterology Patient Name: Dawn Stein Procedure Date: 11/29/2022 8:56 AM MRN: [id number] Account #: [id number] Date of Birth: 31-Jul-1970 Admit Type: Outpatient Age: 52 Room: [facility] ENDO ROOM 1 Gender: Female Note Status: Finalized Instrument Name: Colonscope [id number] Procedure:             Colonoscopy Indications:           Screening for colorectal malignant neoplasm Providers:             Jaynie Collins DO, DO Referring MD:          Barbette Reichmann, MD (Referring MD) Medicines:             Monitored Anesthesia Care Complications:         No immediate complications. Estimated blood loss:                         Minimal. Procedure:             Pre-Anesthesia Assessment:                        - Prior to the procedure, a History and Physical was                         performed, and patient medications and allergies were                         reviewed. The patient is competent. The risks and                         benefits of the procedure and the sedation options and                         risks were discussed with the patient. All questions                         were answered and informed consent was obtained.                         Patient identification and proposed procedure were                         verified by the physician, the nurse, the anesthetist                         and the technician in the endoscopy suite. Mental                         Status Examination: alert and oriented. Airway                         Examination: normal oropharyngeal airway and neck                         mobility. Respiratory Examination: clear to                         auscultation. CV Examination: regular rate and rhythm.  Prophylactic Antibiotics: The patient does not require                         prophylactic antibiotics. Prior Anticoagulants: The                         patient has taken Plavix (clopidogrel),  last dose was                         5 days prior to procedure. ASA Grade Assessment: III -                         A patient with severe systemic disease. After                         reviewing the risks and benefits, the patient was                         deemed in satisfactory condition to undergo the                         procedure. The anesthesia plan was to use monitored                         anesthesia care (MAC). Immediately prior to                         administration of medications, the patient was                         re-assessed for adequacy to receive sedatives. The                         heart rate, respiratory rate, oxygen saturations,                         blood pressure, adequacy of pulmonary ventilation, and                         response to care were monitored throughout the                         procedure. The physical status of the patient was                         re-assessed after the procedure.                        After obtaining informed consent, the colonoscope was                         passed under direct vision. Throughout the procedure,                         the patient's blood pressure, pulse, and oxygen                         saturations were monitored continuously. The  Colonoscope was introduced through the anus and                         advanced to the the cecum, identified by appendiceal                         orifice and ileocecal valve. The colonoscopy was                         somewhat difficult due to multiple diverticula in the                         colon, a redundant colon, significant looping and the                         patient's body habitus. Successful completion of the                         procedure was aided by straightening and shortening                         the scope to obtain bowel loop reduction, using scope                         torsion, applying abdominal pressure and  lavage. The                         patient tolerated the procedure well. The quality of                         the bowel preparation was evaluated using the BBPS                         [facility] Bowel Preparation Scale) with scores of: Right                         Colon = 2 (minor amount of residual staining, small                         fragments of stool and/or opaque liquid, but mucosa                         seen well), Transverse Colon = 3 (entire mucosa seen                         well with no residual staining, small fragments of                         stool or opaque liquid) and Left Colon = 3 (entire                         mucosa seen well with no residual staining, small                         fragments of stool or opaque liquid). The total BBPS  score equals 8. The quality of the bowel preparation                         was excellent. additional difficulty due to frequent                         coughing from patient. The ileocecal valve,                         appendiceal orifice, and rectum were photographed. Findings:      The perianal and digital rectal examinations were normal. Pertinent       negatives include normal sphincter tone.      The colon (entire examined portion) was significantly redundant.       Estimated blood loss: none.      The lumen of the transverse colon and ascending colon was moderately       dilated. Estimated blood loss: none.      A diffuse area of granular mucosa was found in the entire colon.       Biopsies were taken with a cold forceps for histology. Estimated blood       loss was minimal.      Multiple small-mouthed diverticula were found in the left colon.       Estimated blood loss: none.      There was moderate spasm in the entire colon. Estimated blood loss: none.      The exam was otherwise without abnormality on direct and retroflexion       views. Impression:            - Redundant colon.                         - Dilated in the transverse colon and in the ascending                         colon.                        - Granularity in the entire examined colon. Biopsied.                        - Diverticulosis in the left colon.                        - Moderate colonic spasm.                        - The examination was otherwise normal on direct and                         retroflexion views. Recommendation:        - Patient has a contact number available for                         emergencies. The signs and symptoms of potential                         delayed complications were discussed with the patient.  Return to normal activities tomorrow. Written                         discharge instructions were provided to the patient.                        - Discharge patient to home.                        - Resume previous diet.                        - Continue present medications.                        - Resume Plavix (clopidogrel) at prior dose tomorrow.                         Refer to managing physician for further adjustment of                         therapy.                        - Await pathology results.                        - Repeat colonoscopy for surveillance based on                         pathology results.                        - Return to GI office as previously scheduled.                        - The findings and recommendations were discussed with                         the patient. Procedure Code(s):     --- Professional ---                        [id number], Colonoscopy, flexible; with biopsy, single or                         multiple Diagnosis Code(s):     --- Professional ---                        Z12.11, Encounter for screening for malignant neoplasm                         of colon                        K59.39, Other megacolon                        K63.89, Other specified diseases of intestine                        K58.9,  Irritable bowel syndrome without diarrhea  K57.30, Diverticulosis of large intestine without                         perforation or abscess without bleeding                        Q43.8, Other specified congenital malformations of                         intestine CPT copyright 2022 American Medical Association. All rights reserved. The codes documented in this report are preliminary and upon coder review may  be revised to meet current compliance requirements. Attending Participation:      I personally performed the entire procedure. Elfredia Nevins, DO Jaynie Collins DO, DO 11/29/2022 10:32:33 AM This report has been signed electronically. Number of Addenda: 0 Note Initiated On: 11/29/2022 8:56 AM Scope Withdrawal Time: 0 hours 12 minutes 28 seconds  Total Procedure Duration: 0 hours 32 minutes 52 seconds  Estimated Blood Loss:  Estimated blood loss was minimal.      [facility]

## 2022-11-29 NOTE — Anesthesia Preprocedure Evaluation (Signed)
Anesthesia Evaluation  Patient identified by MRN, date of birth, ID band Patient awake    Reviewed: Allergy & Precautions, NPO status , Patient's Chart, lab work & pertinent test results  History of Anesthesia Complications Negative for: history of anesthetic complications  Airway Mallampati: III  TM Distance: >3 FB Neck ROM: Full    Dental no notable dental hx. (+) Teeth Intact   Pulmonary sleep apnea , neg COPD, Current Smoker and Patient abstained from smoking.   Pulmonary exam normal breath sounds clear to auscultation       Cardiovascular Exercise Tolerance: Good METShypertension, Pt. on medications +CHF  (-) CAD and (-) Past MI (-) dysrhythmias + Valvular Problems/Murmurs MR  Rhythm:Regular Rate:Normal - Systolic murmurs TTE 10/23/22:  1. Left ventricular ejection fraction, by estimation, is 60 to 65%. The  left ventricle has normal function. The left ventricle has no regional  wall motion abnormalities. The left ventricular internal cavity size was  moderately dilated. There is mild  left ventricular hypertrophy. Left ventricular diastolic parameters are  consistent with Grade II diastolic dysfunction (pseudonormalization).  Elevated left atrial pressure.   2. Right ventricular systolic function is normal. The right ventricular  size is normal. There is normal pulmonary artery systolic pressure.   3. Left atrial size was mildly dilated.   4. The mitral valve is degenerative. Moderate to severe mitral valve  regurgitation. No evidence of mitral stenosis.   5. The aortic valve has an indeterminant number of cusps. Aortic valve  regurgitation is not visualized. No aortic stenosis is present.   6. The inferior vena cava is normal in size with greater than 50%  respiratory variability, suggesting right atrial pressure of 3 mmHg.     Neuro/Psych Seizures -,  PSYCHIATRIC DISORDERS  Depression    Hx multiple strokes in past.  Came to ED last month for stroke symptoms which had resolved in the ED. Imaging at the time showed no acute abnormalities, attributed to TIA. She was worked up with more imaging and TTE which did not reveal anything new or significant. She was discharged with routine followup.  Hx seizures well controlled, none in many years TIACVA, No Residual Symptoms    GI/Hepatic ,neg GERD  ,,(+)     (-) substance abuse    Endo/Other  neg diabetes  Morbid obesity  Renal/GU CRFRenal disease     Musculoskeletal   Abdominal  (+) + obese  Peds  Hematology   Anesthesia Other Findings Past Medical History: No date: Closed head injury     Comment:  at age 52 No date: Seizures (HCC) No date: Stroke [facility])  Reproductive/Obstetrics                             Anesthesia Physical Anesthesia Plan  ASA: 3  Anesthesia Plan: General   Post-op Pain Management: Minimal or no pain anticipated   Induction: Intravenous  PONV Risk Score and Plan: 2 and Propofol infusion, TIVA and Ondansetron  Airway Management Planned: Nasal Cannula  Additional Equipment: None  Intra-op Plan:   Post-operative Plan:   Informed Consent: I have reviewed the patients History and Physical, chart, labs and discussed the procedure including the risks, benefits and alternatives for the proposed anesthesia with the patient or authorized representative who has indicated his/her understanding and acceptance.     Dental advisory given  Plan Discussed with: CRNA and Surgeon  Anesthesia Plan Comments: (Given that last month's presentation to hospital  for stroke symptoms yielded unchanged diagnostic tests with no acute abnormalities, in setting of hx of multiple strokes in past with established neurology followup, I do not think it is unreasonable to proceed today. I did discuss with patient the higher risk of neurological injury given her risk factors, and she understands.  Discussed risks of  anesthesia with patient, including possibility of difficulty with spontaneous ventilation under anesthesia necessitating airway intervention, PONV, and rare risks such as cardiac or respiratory or neurological events, and allergic reactions. Discussed the role of CRNA in patient's perioperative care. Patient understands. Patient counseled on benefits of smoking cessation, and increased perioperative risks associated with continued smoking. Patient informed about increased incidence of above perioperative risk due to high BMI. Patient understands.   )       Anesthesia Quick Evaluation

## 2022-11-30 ENCOUNTER — Encounter: Payer: Self-pay | Admitting: Gastroenterology

## 2022-12-05 ENCOUNTER — Telehealth: Payer: Self-pay

## 2022-12-05 NOTE — Telephone Encounter (Signed)
Pt left message to discuss colonoscopy wanting to know does she need to take another one not sure she was cleaned out enough please return call

## 2022-12-10 ENCOUNTER — Telehealth: Payer: Self-pay

## 2022-12-10 NOTE — Telephone Encounter (Signed)
Patient left  message waiting to be advised of repeat colonoscopy due to poor prep.  Her colonoscopy was performed 11/29/22 with Elfredia Nevins.  Left voice message asking her to call [facility] for advice.  I also contacted [facility] on her behalf to ask them to reach out to her.  Thanks, [place], [place]

## 2023-08-20 ENCOUNTER — Other Ambulatory Visit: Payer: Self-pay | Admitting: Internal Medicine

## 2023-08-20 DIAGNOSIS — Z1231 Encounter for screening mammogram for malignant neoplasm of breast: Secondary | ICD-10-CM

## 2023-09-09 ENCOUNTER — Ambulatory Visit
Admission: RE | Admit: 2023-09-09 | Discharge: 2023-09-09 | Disposition: A | Discharge status: Home / Self Care | Payer: 59 | Source: Ambulatory Visit | Attending: Internal Medicine | Admitting: Internal Medicine

## 2023-09-09 DIAGNOSIS — Z1231 Encounter for screening mammogram for malignant neoplasm of breast: Secondary | ICD-10-CM | POA: Diagnosis present

## 2023-10-30 ENCOUNTER — Telehealth: Payer: Self-pay | Admitting: Neurology

## 2023-10-30 NOTE — Telephone Encounter (Signed)
 Called the pt and was able to review the CTA head and neck results Pt verbalized understanding. Pt had no questions at this time but was encouraged to call back if questions arise.

## 2023-10-30 NOTE — Telephone Encounter (Signed)
-----   Message from Ardella Beaver sent at 10/29/2023  2:42 PM EDT ----- Louann Rous inform the patient that CT angiogram study of the blood vessels of the brain is slightly suboptimal due to timing of the bolus contrast injection but does not show any major blockages in the brain to worry about ----- Message ----- From: Geneva Kerbs Sent: 10/29/2023   9:17 AM EDT To: Lisabeth Rider, MD

## 2023-12-31 ENCOUNTER — Other Ambulatory Visit: Payer: Self-pay

## 2023-12-31 ENCOUNTER — Emergency Department: Admission: EM | Admit: 2023-12-31 | Discharge: 2023-12-31 | Disposition: A | Discharge status: Home / Self Care

## 2023-12-31 DIAGNOSIS — T2125XA Burn of second degree of buttock, initial encounter: Secondary | ICD-10-CM | POA: Diagnosis not present

## 2023-12-31 DIAGNOSIS — I129 Hypertensive chronic kidney disease with stage 1 through stage 4 chronic kidney disease, or unspecified chronic kidney disease: Secondary | ICD-10-CM | POA: Insufficient documentation

## 2023-12-31 DIAGNOSIS — X19XXXA Contact with other heat and hot substances, initial encounter: Secondary | ICD-10-CM | POA: Insufficient documentation

## 2023-12-31 DIAGNOSIS — Z23 Encounter for immunization: Secondary | ICD-10-CM | POA: Diagnosis not present

## 2023-12-31 DIAGNOSIS — N189 Chronic kidney disease, unspecified: Secondary | ICD-10-CM | POA: Insufficient documentation

## 2023-12-31 DIAGNOSIS — T2124XA Burn of second degree of lower back, initial encounter: Secondary | ICD-10-CM | POA: Insufficient documentation

## 2023-12-31 DIAGNOSIS — T2104XA Burn of unspecified degree of lower back, initial encounter: Secondary | ICD-10-CM | POA: Diagnosis present

## 2023-12-31 MED ORDER — ACETAMINOPHEN 500 MG PO TABS: 1000.0000 mg | ORAL_TABLET | Freq: Four times a day (QID) | ORAL | 2 refills | Status: AC | PRN

## 2023-12-31 MED ORDER — BACITRACIN ZINC 500 UNIT/GM EX OINT: TOPICAL_OINTMENT | Freq: Every day | CUTANEOUS | 0 refills | Status: AC

## 2023-12-31 MED ORDER — BACITRACIN ZINC 500 UNIT/GM EX OINT
TOPICAL_OINTMENT | Freq: Once | CUTANEOUS | Status: AC
  Filled 2023-12-31: qty 0.9

## 2023-12-31 MED ORDER — TETANUS-DIPHTH-ACELL PERTUSSIS 5-2.5-18.5 LF-MCG/0.5 IM SUSY
0.5000 mL | PREFILLED_SYRINGE | Freq: Once | INTRAMUSCULAR | Status: AC
  Administered 2023-12-31: 0.5 mL via INTRAMUSCULAR
  Filled 2023-12-31: qty 0.5
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
## 2023-12-31 NOTE — ED Provider Notes (Signed)
 [facility] Provider Note    Event Date/Time   First MD Initiated Contact with Patient 12/31/23 2137     (approximate)   History   Burn  Pt to ED via POV c/o burn to lower back. Pt states she was getting her hair done when the straightener fell and burned her mid to lower back. Pt with multiple blisters.   HPI Dawn Stein is a 53 y.o. female PMH multiple medical comorbidities including hyperlipidemia, CKD, seizure disorder, hypertension, antiphospholipid syndrome presents for evaluation of a burn - Patient was at her dresser earlier today, felt the curling iron may have touched her head so moved out of the chair and fell forward, hit her dresser attempted to catch her and fell on her back.  Currently on iron then brought the patient's back.  Was painful earlier but now less so.     Physical Exam   Triage Vital Signs: ED Triage Vitals  Encounter Vitals Group     BP 12/31/23 1953 125/68     Girls Systolic BP Percentile --      Girls Diastolic BP Percentile --      Boys Systolic BP Percentile --      Boys Diastolic BP Percentile --      Pulse Rate 12/31/23 1953 69     Resp 12/31/23 1953 18     Temp 12/31/23 1953 98.4 F (36.9 C)     Temp Source 12/31/23 1953 Oral     SpO2 12/31/23 1953 99 %     Weight 12/31/23 1952 235 lb (106.6 kg)     Height 12/31/23 1952 5' 2 (1.575 m)     Head Circumference --      Peak Flow --      Pain Score 12/31/23 1952 2     Pain Loc --      Pain Education --      Exclude from Growth Chart --     Most recent vital signs: Vitals:   12/31/23 1953  BP: 125/68  Pulse: 69  Resp: 18  Temp: 98.4 F (36.9 C)  SpO2: 99%     General: Awake, no distress.  CV:  Good peripheral perfusion. RRR, RP 2+ Resp:  Normal effort. CTAB Abd:  No distention. Nontender to deep palpation throughout Skin:  Partial-thickness burn with some overlying blisters to left mid and lower back/buttock.  2 primary areas, one  measuring about 12 cm x 5 cm and the other measuring about 4 cm x 6 cm.  Sensation intact.  No necrotic tissue, no underlying structures visible.   ED Results / Procedures / Treatments   Labs (all labs ordered are listed, but only abnormal results are displayed) Labs Reviewed - No data to display   EKG  N/a   RADIOLOGY  N/a   PROCEDURES:  Critical Care performed: No  Procedures   MEDICATIONS ORDERED IN ED: Medications  Tdap (BOOSTRIX) injection 0.5 mL (0.5 mLs Intramuscular Given 12/31/23 2216)  bacitracin ointment ( Topical Given 12/31/23 2220)     IMPRESSION / MDM / ASSESSMENT AND PLAN / ED COURSE  I reviewed the triage vital signs and the nursing notes.                              DDX/MDM/AP: Differential diagnosis includes, but is not limited to, partial-thickness burn, no evidence of other complications at this time.    Plan: - Update  Tdap - Bacitracin - Recommend Tylenol , Motrin as needed - PMD follow-up  Patient's presentation is most consistent with acute, uncomplicated illness.   ED course below.  Tdap updated, bacitracin applied, wound dressings applied.  Rx bacitracin, Tylenol .  Plan for PMD follow-up.  ED return precautions in place.  Patient agrees with plan.      FINAL CLINICAL IMPRESSION(S) / ED DIAGNOSES   Final diagnoses:  Partial thickness burn of back, initial encounter     Rx / DC Orders   ED Discharge Orders          Ordered    acetaminophen  (TYLENOL ) 500 MG tablet  Every 6 hours PRN        12/31/23 2226    bacitracin ointment  Daily        12/31/23 2226             Note:  This document was prepared using Dragon voice recognition software and may include unintentional dictation errors.   Clarine Ozell LABOR, MD 12/31/23 2227
